# Patient Record
Sex: Male | Born: 1965 | Race: White | Hispanic: No | Smoking: Former smoker
Health system: Southern US, Community
[De-identification: ages and names within clinical notes are randomized; demographics above are authoritative.]

## PROBLEM LIST (undated history)

## (undated) DIAGNOSIS — Z8709 Personal history of other diseases of the respiratory system: Secondary | ICD-10-CM

## (undated) HISTORY — DX: Personal history of other diseases of the respiratory system: Z87.09

## (undated) HISTORY — PX: COLONOSCOPY: SHX174

## (undated) HISTORY — PX: EYE MUSCLE SURGERY: SHX370

---
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
## 2000-11-19 ENCOUNTER — Encounter: Payer: Self-pay | Admitting: Emergency Medicine

## 2000-11-19 ENCOUNTER — Encounter: Admission: RE | Admit: 2000-11-19 | Discharge: 2000-11-19 | Payer: Self-pay | Admitting: Emergency Medicine

## 2007-06-21 ENCOUNTER — Emergency Department (HOSPITAL_COMMUNITY): Admission: EM | Admit: 2007-06-21 | Discharge: 2007-06-21 | Payer: Self-pay | Admitting: Emergency Medicine

## 2007-06-24 ENCOUNTER — Encounter: Admission: RE | Admit: 2007-06-24 | Discharge: 2007-06-24 | Payer: Self-pay | Admitting: Emergency Medicine

## 2010-12-02 LAB — DIFFERENTIAL
Basophils Absolute: 0.2 — ABNORMAL HIGH
Basophils Relative: 3 — ABNORMAL HIGH
Eosinophils Absolute: 0.1
Eosinophils Relative: 1
Lymphocytes Relative: 18
Lymphs Abs: 1.5
Monocytes Absolute: 0.8
Monocytes Relative: 10
Neutro Abs: 5.7
Neutrophils Relative %: 68

## 2010-12-02 LAB — CBC
HCT: 43
Hemoglobin: 14.6
MCHC: 33.9
MCV: 85.9
Platelets: 245
RBC: 5.01
RDW: 13.7
WBC: 8.4

## 2010-12-02 LAB — POCT I-STAT, CHEM 8
Chloride: 105
Glucose, Bld: 106 — ABNORMAL HIGH
HCT: 44
Hemoglobin: 15
Potassium: 3.3 — ABNORMAL LOW
Sodium: 140

## 2010-12-02 LAB — POCT CARDIAC MARKERS
CKMB, poc: 1.4
CKMB, poc: 1.7
Myoglobin, poc: 98.3
Operator id: 277751

## 2010-12-02 LAB — D-DIMER, QUANTITATIVE: D-Dimer, Quant: 0.46

## 2013-11-23 ENCOUNTER — Encounter: Payer: Self-pay | Admitting: Internal Medicine

## 2013-11-29 ENCOUNTER — Ambulatory Visit (AMBULATORY_SURGERY_CENTER): Payer: Self-pay | Admitting: *Deleted

## 2013-11-29 VITALS — Ht 71.0 in | Wt 252.8 lb

## 2013-11-29 DIAGNOSIS — Z1211 Encounter for screening for malignant neoplasm of colon: Secondary | ICD-10-CM

## 2013-11-29 NOTE — Progress Notes (Signed)
No allergies to eggs or soy. No problem with anesthesia.  Pt given Emmi instructions for colonoscopy  No oxygen use  No diet drug use

## 2013-12-06 ENCOUNTER — Ambulatory Visit (AMBULATORY_SURGERY_CENTER): Payer: Commercial Managed Care - PPO | Admitting: Internal Medicine

## 2013-12-06 ENCOUNTER — Encounter: Payer: Self-pay | Admitting: Internal Medicine

## 2013-12-06 VITALS — BP 113/70 | HR 57 | Temp 96.3°F | Resp 19 | Ht 71.0 in | Wt 252.0 lb

## 2013-12-06 DIAGNOSIS — Z1211 Encounter for screening for malignant neoplasm of colon: Secondary | ICD-10-CM

## 2013-12-06 MED ORDER — SODIUM CHLORIDE 0.9 % IV SOLN: 500.0000 mL | INTRAVENOUS | Status: DC

## 2013-12-06 NOTE — Patient Instructions (Addendum)
Your colonoscopy was normal.  Next routine colonoscopy in 10 years - 2025  I appreciate the opportunity to care for you. Babe Anthis E. Kevonna Nolte, MD, FACG   YOU HAD AN ENDOSCOPIC PROCEDURE TODAY AT THE [facility]: Refer to the procedure report that was given to you for any specific questions about what was found during the examination.  If the procedure report does not answer your questions, please call your gastroenterologist to clarify.  If you requested that your care partner not be given the details of your procedure findings, then the procedure report has been included in a sealed envelope for you to review at your convenience later.  YOU SHOULD EXPECT: Some feelings of bloating in the abdomen. Passage of more gas than usual.  Walking can help get rid of the air that was put into your GI tract during the procedure and reduce the bloating. If you had a lower endoscopy (such as a colonoscopy or flexible sigmoidoscopy) you may notice spotting of blood in your stool or on the toilet paper. If you underwent a bowel prep for your procedure, then you may not have a normal bowel movement for a few days.  DIET: Your first meal following the procedure should be a light meal and then it is ok to progress to your normal diet.  A half-sandwich or bowl of soup is an example of a good first meal.  Heavy or fried foods are harder to digest and may make you feel nauseous or bloated.  Likewise meals heavy in dairy and vegetables can cause extra gas to form and this can also increase the bloating.  Drink plenty of fluids but you should avoid alcoholic beverages for 24 hours.  ACTIVITY: Your care partner should take you home directly after the procedure.  You should plan to take it easy, moving slowly for the rest of the day.  You can resume normal activity the day after the procedure however you should NOT DRIVE or use heavy machinery for 24 hours (because of the sedation medicines used during the test).     SYMPTOMS TO REPORT IMMEDIATELY: A gastroenterologist can be reached at any hour.  During normal business hours, 8:30 AM to 5:00 PM Monday through Friday, call [phone].  After hours and on weekends, please call the GI answering service at [phone] who will take a message and have the physician on call contact you.   Following lower endoscopy (colonoscopy or flexible sigmoidoscopy):  Excessive amounts of blood in the stool  Significant tenderness or worsening of abdominal pains  Swelling of the abdomen that is new, acute  Fever of 100F or higher  FOLLOW UP: If any biopsies were taken you will be contacted by phone or by letter within the next 1-3 weeks.  Call your gastroenterologist if you have not heard about the biopsies in 3 weeks.  Our staff will call the home number listed on your records the next business day following your procedure to check on you and address any questions or concerns that you may have at that time regarding the information given to you following your procedure. This is a courtesy call and so if there is no answer at the home number and we have not heard from you through the emergency physician on call, we will assume that you have returned to your regular daily activities without incident.  SIGNATURES/CONFIDENTIALITY: You and/or your care partner have signed paperwork which will be entered into your electronic medical record.  These   signatures attest to the fact that that the information above on your After Visit Summary has been reviewed and is understood.  Full responsibility of the confidentiality of this discharge information lies with you and/or your care-partner. 

## 2013-12-06 NOTE — Progress Notes (Signed)
Patient awakening,vss,report to rn 

## 2013-12-06 NOTE — Op Note (Signed)
[facility] [address] [place], [id number]   COLONOSCOPY PROCEDURE REPORT  PATIENT: [name], Nicholas A  MR#: [id number] BIRTHDATE: 05-06-1965 , 48  yrs. old GENDER: male ENDOSCOPIST: Iva Booparl E Ellamarie Naeve, MD, [facility] PROCEDURE DATE:  12/06/2013 PROCEDURE:   Colonoscopy, screening First Screening Colonoscopy - Avg.  risk and is 50 yrs.  old or older Yes.  Prior Negative Screening - Now for repeat screening. N/A  History of Adenoma - Now for follow-up colonoscopy & has been > or = to 3 yrs.  N/A  Polyps Removed Today? No.  Polyps Removed Today? No.  Recommend repeat exam, <10 yrs? Polyps Removed Today? No.  Recommend repeat exam, <10 yrs? No. ASA CLASS:   Class II INDICATIONS:average risk for colorectal cancer and first colonoscopy. MEDICATIONS: Propofol 250 mg IV and Monitored anesthesia care  DESCRIPTION OF PROCEDURE:   After the risks benefits and alternatives of the procedure were thoroughly explained, informed consent was obtained.  The digital rectal exam revealed no abnormalities of the rectum and revealed no prostatic nodules. The LB [id number]  endoscope was introduced through the anus and advanced to the cecum, which was identified by both the appendix and ileocecal valve. No adverse events experienced.   The quality of the prep was excellent, using MiraLax  The instrument was then slowly withdrawn as the colon was fully examined.      COLON FINDINGS: A normal appearing cecum, ileocecal valve, and appendiceal orifice were identified.  The ascending, transverse, descending, sigmoid colon, and rectum appeared unremarkable. Right colon retroflexion included.  Retroflexed views revealed no abnormalities. The time to cecum=2 minutes 20 seconds.  Withdrawal time=8 minutes 28 seconds.  The scope was withdrawn and the procedure completed. COMPLICATIONS: There were no immediate complications.  ENDOSCOPIC IMPRESSION: Normal colonoscopy - excellent prep -  first screening  RECOMMENDATIONS: Repeat colonoscopy 10 years - 2025  eSigned:  Iva Booparl E Hoyt Leanos, MD, [facility] 12/06/2013 10:44 AM   cc: Creola CornJohn Russo, MD and The Patient

## 2013-12-07 ENCOUNTER — Telehealth: Payer: Self-pay | Admitting: *Deleted

## 2013-12-07 NOTE — Telephone Encounter (Signed)
No answer. Name identifier. Message left to call if any questions or concerns. 

## 2014-03-09 HISTORY — PX: KNEE ARTHROSCOPY: SUR90

## 2014-12-08 DIAGNOSIS — I2699 Other pulmonary embolism without acute cor pulmonale: Secondary | ICD-10-CM

## 2014-12-08 DIAGNOSIS — S82209A Unspecified fracture of shaft of unspecified tibia, initial encounter for closed fracture: Secondary | ICD-10-CM

## 2014-12-08 HISTORY — DX: Unspecified fracture of shaft of unspecified tibia, initial encounter for closed fracture: S82.209A

## 2014-12-08 HISTORY — DX: Other pulmonary embolism without acute cor pulmonale: I26.99

## 2015-02-09 ENCOUNTER — Inpatient Hospital Stay (HOSPITAL_COMMUNITY)
Admission: EM | Admit: 2015-02-09 | Discharge: 2015-02-14 | DRG: 176 | Disposition: A | Discharge status: Home / Self Care | Payer: Commercial Managed Care - PPO | Attending: Internal Medicine | Admitting: Internal Medicine

## 2015-02-09 ENCOUNTER — Encounter (HOSPITAL_COMMUNITY): Payer: Self-pay | Admitting: Emergency Medicine

## 2015-02-09 ENCOUNTER — Emergency Department (HOSPITAL_COMMUNITY): Payer: Commercial Managed Care - PPO

## 2015-02-09 ENCOUNTER — Inpatient Hospital Stay (HOSPITAL_COMMUNITY): Payer: Commercial Managed Care - PPO

## 2015-02-09 DIAGNOSIS — S82142A Displaced bicondylar fracture of left tibia, initial encounter for closed fracture: Secondary | ICD-10-CM | POA: Diagnosis present

## 2015-02-09 DIAGNOSIS — S82142S Displaced bicondylar fracture of left tibia, sequela: Secondary | ICD-10-CM

## 2015-02-09 DIAGNOSIS — D72829 Elevated white blood cell count, unspecified: Secondary | ICD-10-CM | POA: Diagnosis not present

## 2015-02-09 DIAGNOSIS — I82409 Acute embolism and thrombosis of unspecified deep veins of unspecified lower extremity: Secondary | ICD-10-CM

## 2015-02-09 DIAGNOSIS — R9431 Abnormal electrocardiogram [ECG] [EKG]: Secondary | ICD-10-CM | POA: Diagnosis present

## 2015-02-09 DIAGNOSIS — E876 Hypokalemia: Secondary | ICD-10-CM | POA: Diagnosis present

## 2015-02-09 DIAGNOSIS — R0602 Shortness of breath: Secondary | ICD-10-CM | POA: Diagnosis present

## 2015-02-09 DIAGNOSIS — I82433 Acute embolism and thrombosis of popliteal vein, bilateral: Secondary | ICD-10-CM | POA: Diagnosis present

## 2015-02-09 DIAGNOSIS — I2699 Other pulmonary embolism without acute cor pulmonale: Secondary | ICD-10-CM

## 2015-02-09 DIAGNOSIS — R000 Tachycardia, unspecified: Secondary | ICD-10-CM | POA: Diagnosis present

## 2015-02-09 DIAGNOSIS — I82403 Acute embolism and thrombosis of unspecified deep veins of lower extremity, bilateral: Secondary | ICD-10-CM | POA: Diagnosis present

## 2015-02-09 DIAGNOSIS — Z7982 Long term (current) use of aspirin: Secondary | ICD-10-CM | POA: Diagnosis not present

## 2015-02-09 DIAGNOSIS — I82413 Acute embolism and thrombosis of femoral vein, bilateral: Secondary | ICD-10-CM | POA: Diagnosis present

## 2015-02-09 DIAGNOSIS — I82442 Acute embolism and thrombosis of left tibial vein: Secondary | ICD-10-CM | POA: Diagnosis present

## 2015-02-09 DIAGNOSIS — D72828 Other elevated white blood cell count: Secondary | ICD-10-CM | POA: Diagnosis present

## 2015-02-09 DIAGNOSIS — Z0000 Encounter for general adult medical examination without abnormal findings: Secondary | ICD-10-CM

## 2015-02-09 DIAGNOSIS — Z87891 Personal history of nicotine dependence: Secondary | ICD-10-CM

## 2015-02-09 DIAGNOSIS — I4581 Long QT syndrome: Secondary | ICD-10-CM

## 2015-02-09 LAB — BASIC METABOLIC PANEL
Anion gap: 12 (ref 5–15)
BUN: 14 mg/dL (ref 6–20)
CALCIUM: 9.4 mg/dL (ref 8.9–10.3)
CO2: 24 mmol/L (ref 22–32)
CREATININE: 0.99 mg/dL (ref 0.61–1.24)
Chloride: 102 mmol/L (ref 101–111)
GFR calc Af Amer: >60 mL/min (ref >60)
Glucose, Bld: 120 mg/dL — ABNORMAL HIGH (ref 65–99)
Potassium: 3.4 mmol/L — ABNORMAL LOW (ref 3.5–5.1)
SODIUM: 138 mmol/L (ref 135–145)

## 2015-02-09 LAB — ANTITHROMBIN III: AntiThromb III Func: 97 % (ref 75–120)

## 2015-02-09 LAB — CBC
HCT: 44.6 % (ref 39.0–52.0)
HEMATOCRIT: 39.2 % (ref 39.0–52.0)
HEMOGLOBIN: 12.5 g/dL — AB (ref 13.0–17.0)
Hemoglobin: 14.9 g/dL (ref 13.0–17.0)
MCH: 29 pg (ref 26.0–34.0)
MCH: 28.1 pg (ref 26.0–34.0)
MCHC: 33.4 g/dL (ref 30.0–36.0)
MCHC: 31.9 g/dL (ref 30.0–36.0)
MCV: 86.8 fL (ref 78.0–100.0)
MCV: 88.1 fL (ref 78.0–100.0)
PLATELETS: 331 10*3/uL (ref 150–400)
Platelets: 281 10*3/uL (ref 150–400)
RBC: 5.14 MIL/uL (ref 4.22–5.81)
RBC: 4.45 MIL/uL (ref 4.22–5.81)
RDW: 13 % (ref 11.5–15.5)
RDW: 13.2 % (ref 11.5–15.5)
WBC: 10.9 10*3/uL — AB (ref 4.0–10.5)
WBC: 13.7 10*3/uL — AB (ref 4.0–10.5)

## 2015-02-09 LAB — URINE MICROSCOPIC-ADD ON
BACTERIA UA: NONE SEEN
RBC / HPF: NONE SEEN RBC/hpf (ref 0–5)
SQUAMOUS EPITHELIAL / LPF: NONE SEEN
WBC UA: NONE SEEN WBC/hpf (ref 0–5)

## 2015-02-09 LAB — URINALYSIS, ROUTINE W REFLEX MICROSCOPIC
Bilirubin Urine: NEGATIVE
Glucose, UA: NEGATIVE mg/dL
KETONES UR: NEGATIVE mg/dL
LEUKOCYTES UA: NEGATIVE
NITRITE: NEGATIVE
PROTEIN: NEGATIVE mg/dL
Specific Gravity, Urine: >1.046 — ABNORMAL HIGH (ref 1.005–1.030)
pH: 5.5 (ref 5.0–8.0)

## 2015-02-09 LAB — COMPREHENSIVE METABOLIC PANEL
ALBUMIN: 3.3 g/dL — AB (ref 3.5–5.0)
ALT: 27 U/L (ref 17–63)
ANION GAP: 7 (ref 5–15)
AST: 20 U/L (ref 15–41)
Alkaline Phosphatase: 99 U/L (ref 38–126)
BILIRUBIN TOTAL: 0.8 mg/dL (ref 0.3–1.2)
BUN: 11 mg/dL (ref 6–20)
CALCIUM: 8.6 mg/dL — AB (ref 8.9–10.3)
CO2: 25 mmol/L (ref 22–32)
Chloride: 106 mmol/L (ref 101–111)
Creatinine, Ser: 0.89 mg/dL (ref 0.61–1.24)
GFR calc non Af Amer: >60 mL/min (ref >60)
GLUCOSE: 99 mg/dL (ref 65–99)
POTASSIUM: 3.9 mmol/L (ref 3.5–5.1)
SODIUM: 138 mmol/L (ref 135–145)
TOTAL PROTEIN: 7 g/dL (ref 6.5–8.1)

## 2015-02-09 LAB — HEPARIN LEVEL (UNFRACTIONATED)
HEPARIN UNFRACTIONATED: 0.27 [IU]/mL — AB (ref 0.30–0.70)
Heparin Unfractionated: 0.17 IU/mL — ABNORMAL LOW (ref 0.30–0.70)

## 2015-02-09 LAB — I-STAT CHEM 8, ED
BUN: 13 mg/dL (ref 6–20)
CALCIUM ION: 1.12 mmol/L (ref 1.12–1.23)
Chloride: 101 mmol/L (ref 101–111)
Creatinine, Ser: 0.9 mg/dL (ref 0.61–1.24)
GLUCOSE: 119 mg/dL — AB (ref 65–99)
HCT: 49 % (ref 39.0–52.0)
HEMOGLOBIN: 16.7 g/dL (ref 13.0–17.0)
Potassium: 3.4 mmol/L — ABNORMAL LOW (ref 3.5–5.1)
Sodium: 139 mmol/L (ref 135–145)
TCO2: 25 mmol/L (ref 0–100)

## 2015-02-09 LAB — TYPE AND SCREEN
ABO/RH(D): O NEG
ANTIBODY SCREEN: NEGATIVE

## 2015-02-09 LAB — I-STAT TROPONIN, ED: Troponin i, poc: 0 ng/mL (ref 0.00–0.08)

## 2015-02-09 LAB — APTT: APTT: 30 s (ref 24–37)

## 2015-02-09 LAB — ABO/RH: ABO/RH(D): O NEG

## 2015-02-09 LAB — PROTIME-INR
INR: 1.11 (ref 0.00–1.49)
Prothrombin Time: 14.5 seconds (ref 11.6–15.2)

## 2015-02-09 MED ORDER — SODIUM CHLORIDE 0.9 % IJ SOLN
3.0000 mL | Freq: Two times a day (BID) | INTRAMUSCULAR | Status: DC
  Administered 2015-02-10 – 2015-02-14 (×7): 3 mL via INTRAVENOUS

## 2015-02-09 MED ORDER — HYDROCODONE-ACETAMINOPHEN 5-325 MG PO TABS
2.0000 | ORAL_TABLET | ORAL | Status: DC | PRN
  Administered 2015-02-09 (×4): 2 via ORAL
  Filled 2015-02-09 (×5): qty 2

## 2015-02-09 MED ORDER — MORPHINE SULFATE (PF) 2 MG/ML IV SOLN
2.0000 mg | INTRAVENOUS | Status: DC | PRN
  Administered 2015-02-09 (×2): 2 mg via INTRAVENOUS
  Filled 2015-02-09 (×2): qty 1

## 2015-02-09 MED ORDER — ZOLPIDEM TARTRATE 5 MG PO TABS: 5.0000 mg | ORAL_TABLET | Freq: Every evening | ORAL | Status: DC | PRN

## 2015-02-09 MED ORDER — HEPARIN BOLUS VIA INFUSION
5000.0000 [IU] | Freq: Once | INTRAVENOUS | Status: AC
  Administered 2015-02-09: 5000 [IU] via INTRAVENOUS
  Filled 2015-02-09: qty 5000

## 2015-02-09 MED ORDER — ZOLPIDEM TARTRATE 10 MG PO TABS
10.0000 mg | ORAL_TABLET | Freq: Every day | ORAL | Status: DC
  Administered 2015-02-09 – 2015-02-10 (×2): 10 mg via ORAL
  Filled 2015-02-09 (×2): qty 1

## 2015-02-09 MED ORDER — KETOROLAC TROMETHAMINE 30 MG/ML IJ SOLN
30.0000 mg | Freq: Four times a day (QID) | INTRAMUSCULAR | Status: AC | PRN
  Administered 2015-02-09 – 2015-02-14 (×7): 30 mg via INTRAVENOUS
  Filled 2015-02-09 (×7): qty 1

## 2015-02-09 MED ORDER — POTASSIUM CHLORIDE CRYS ER 20 MEQ PO TBCR
40.0000 meq | EXTENDED_RELEASE_TABLET | Freq: Once | ORAL | Status: AC
  Administered 2015-02-09: 40 meq via ORAL
  Filled 2015-02-09: qty 2

## 2015-02-09 MED ORDER — HEPARIN BOLUS VIA INFUSION
2500.0000 [IU] | Freq: Once | INTRAVENOUS | Status: AC
  Administered 2015-02-09: 2500 [IU] via INTRAVENOUS
  Filled 2015-02-09: qty 2500

## 2015-02-09 MED ORDER — IOHEXOL 350 MG/ML SOLN
100.0000 mL | Freq: Once | INTRAVENOUS | Status: AC | PRN
  Administered 2015-02-09: 100 mL via INTRAVENOUS

## 2015-02-09 MED ORDER — LEVALBUTEROL HCL 0.63 MG/3ML IN NEBU: 0.6300 mg | INHALATION_SOLUTION | Freq: Four times a day (QID) | RESPIRATORY_TRACT | Status: DC | PRN

## 2015-02-09 MED ORDER — SODIUM CHLORIDE 0.9 % IV BOLUS (SEPSIS)
1000.0000 mL | Freq: Once | INTRAVENOUS | Status: AC
  Administered 2015-02-09: 1000 mL via INTRAVENOUS

## 2015-02-09 MED ORDER — FENTANYL CITRATE (PF) 100 MCG/2ML IJ SOLN
100.0000 ug | Freq: Once | INTRAMUSCULAR | Status: AC
Start: 2015-02-09 — End: 2015-02-09
  Administered 2015-02-09: 100 ug via INTRAVENOUS
  Filled 2015-02-09: qty 2

## 2015-02-09 MED ORDER — ACETAMINOPHEN 325 MG PO TABS: 650.0000 mg | ORAL_TABLET | Freq: Four times a day (QID) | ORAL | Status: DC | PRN

## 2015-02-09 MED ORDER — ALUM & MAG HYDROXIDE-SIMETH 200-200-20 MG/5ML PO SUSP: 30.0000 mL | Freq: Four times a day (QID) | ORAL | Status: DC | PRN

## 2015-02-09 MED ORDER — FENTANYL CITRATE (PF) 100 MCG/2ML IJ SOLN
50.0000 ug | Freq: Once | INTRAMUSCULAR | Status: AC
  Administered 2015-02-09: 50 ug via INTRAVENOUS
  Filled 2015-02-09: qty 2

## 2015-02-09 MED ORDER — HEPARIN (PORCINE) IN NACL 100-0.45 UNIT/ML-% IJ SOLN
1700.0000 [IU]/h | INTRAMUSCULAR | Status: DC
  Administered 2015-02-09: 1700 [IU]/h via INTRAVENOUS
  Filled 2015-02-09: qty 250

## 2015-02-09 MED ORDER — HEPARIN (PORCINE) IN NACL 100-0.45 UNIT/ML-% IJ SOLN
1700.0000 [IU]/h | INTRAMUSCULAR | Status: DC
  Administered 2015-02-09: 1400 [IU]/h via INTRAVENOUS
  Administered 2015-02-09: 1300 [IU]/h via INTRAVENOUS
  Filled 2015-02-09 (×2): qty 250

## 2015-02-09 NOTE — ED Notes (Signed)
Pt voided in urinal. Reports severe CP and some sob.  Gave IVP meds that were ordered, see MAR.  Pt reports relief of pain after receiving meds.

## 2015-02-09 NOTE — Progress Notes (Signed)
ANTICOAGULATION CONSULT NOTE - Follow Up Consult  Pharmacy Consult for heparin IV Indication: suspected pulmonary embolus per CTA  No Known Allergies  Patient Measurements: Height: 5\' 10"  (177.8 cm) Weight: 217 lb 2.5 oz (98.5 kg) IBW/kg (Calculated) : 73 Heparin Dosing Weight: 93 kg  Vital Signs: Temp: 97.8 F (36.6 C) (12/03 0339) Temp Source: Oral (12/03 0339) BP: 128/75 mmHg (12/03 0838) Pulse Rate: 103 (12/03 0838)  Labs:  Recent Labs  02/09/15 0039 02/09/15 0045 02/09/15 0750 02/09/15 1100  HGB 16.7 14.9 12.5*  --   HCT 49.0 44.6 39.2  --   PLT  --  331 281  --   APTT  --  30  --   --   LABPROT  --  14.5  --   --   INR  --  1.11  --   --   HEPARINUNFRC  --   --   --  0.27*  CREATININE 0.90 0.99 0.89  --     Estimated Creatinine Clearance: 115.6 mL/min (by C-G formula based on Cr of 0.89).   Assessment: Patient is a 49 y.o M presented to the ED on 12/3 with c/o SOB and CP.  chest CTA is suspicious for BL LL PE.  Heparin drip started for thrombosis.  First heparin drip now back slightly sub-therapuric at 0.27.  CBC stable, no bleeding documented.  Goal of Therapy:  Heparin level 0.3-0.7 units/ml Monitor platelets by anticoagulation protocol: Yes   Plan:  - Increase heparin drip to 1400 units/hr - recheck another 6 hour heparin level  Nicholas Conway P 02/09/2015,11:48 AM

## 2015-02-09 NOTE — ED Notes (Signed)
Patient presents for SOB and left lower chest pain that started earlier this evening. Patient denies N/V, diaphoresis, fever or chills. Patient reports aspirin therapy for recent left leg surgery. Patient states he's concerned he has a PE.
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
## 2015-02-09 NOTE — Progress Notes (Signed)
PT Cancellation Note  Patient Details Name: Nicholas PinkJohn A Conway MRN: [id number] DOB: [birth year]   Cancelled Treatment:     PT eval order received but completion deferred at RN request 2* pt lethargy.  Will follow.   Juleah Paradise 02/09/2015, 11:08 AM

## 2015-02-09 NOTE — Progress Notes (Signed)
OT Cancellation Note  Patient Details Name: Nicholas Conway A I[place]ine MRN: [id number] DOB: August 15, 1965   Cancelled Treatment:    Reason Eval/Treat Not Completed: Other (comment) --  OT eval order received but completion deferred at RN request 2* pt lethargy. Will follow.  Lonna Rabold A 02/09/2015, 4:03 PM

## 2015-02-09 NOTE — Progress Notes (Signed)
I have seen and assessed patient and agree with Dr.Niu's assessment and plan. Patient is a pleasant [age] year old gentleman with recent left tibial plateau fracture presented with shortness of breath and chest pain and found to have PE per CT angiogram. 2-D echo and lower extremity Dopplers pending. Continue IV heparin, pain management, supportive care.

## 2015-02-09 NOTE — Progress Notes (Signed)
ANTICOAGULATION CONSULT NOTE - Initial Consult  Pharmacy Consult for IV Heparin Indication: pulmonary embolus  No Known Allergies  Patient Measurements:   Hw=80 kg  Vital Signs: Temp: 98.2 F (36.8 C) (12/03 0110) BP: 118/91 mmHg (12/03 0110) Pulse Rate: 113 (12/03 0110)  Labs:  Recent Labs  02/09/15 0039 02/09/15 0045  HGB 16.7 14.9  HCT 49.0 44.6  PLT  --  331  APTT  --  30  LABPROT  --  14.5  INR  --  1.11  CREATININE 0.90 0.99    CrCl cannot be calculated (Unknown ideal weight.).   Medical History: Past Medical History  Diagnosis Date  . Hx of pneumothorax 1983    Medications:   (Not in a hospital admission) Scheduled:  . sodium chloride  3 mL Intravenous Q12H   Infusions:    Assessment: 49 yoM with pleuritic CP, CT suspicious for bilateral LL PE.  IV Heparin per Rx.  Goal of Therapy:  Heparin level 0.3-0.7 units/ml Monitor platelets by anticoagulation protocol: Yes   Plan:   Heparin 5000 unit bolus x1  Heparin drip @1300  units/hr  1st HL 10:30 am  Daily HL/CBC  Susanne GreenhouseGreen, Meloni Hinz R 02/09/2015,2:56 AM

## 2015-02-09 NOTE — Progress Notes (Signed)
Echocardiogram 2D Echocardiogram has been performed.  02/09/2015 12:43 PM Gertie FeyMichelle Doreather Hoxworth, RVT, RDCS, RDMS for Perley Jainich Lombardo, RDCS

## 2015-02-09 NOTE — Progress Notes (Signed)
Report received from A. Helsabeck,RN. No change in assessment. Cathi Hazan Johnson 
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
## 2015-02-09 NOTE — Progress Notes (Signed)
Pharmacy - Brief Note (heparin level follow-up)  Pharmacy Consult for heparin IV Indication: suspected pulmonary embolus per CTA  See pharmacist's note from earlier today for details  Recheck heparin level following rate adjust = 0.17 (down from 0.27 = initial level).  RN states no known issues with infusion from time she picked up patient and running at expected rate of 1400 units/hr. Suspect initial level affected by bolus  Heparin Dosing Weight: 93 kg  Plan:  Heparin bolus 3000 units then Increase heparin gtt to 1700 units/hr  Check 6h heparin level  Daily heparin level, CBC  Juliette Alcideustin Zeigler, PharmD, BCPS.   Pager: [phone] 02/09/2015 7:15 PM

## 2015-02-09 NOTE — ED Notes (Signed)
spo2 92% on RA, placed pt on 2L [place]

## 2015-02-09 NOTE — ED Notes (Signed)
Patient transported to CT 

## 2015-02-10 ENCOUNTER — Inpatient Hospital Stay (HOSPITAL_COMMUNITY): Payer: Commercial Managed Care - PPO

## 2015-02-10 DIAGNOSIS — I82403 Acute embolism and thrombosis of unspecified deep veins of lower extremity, bilateral: Secondary | ICD-10-CM | POA: Diagnosis present

## 2015-02-10 DIAGNOSIS — S82142D Displaced bicondylar fracture of left tibia, subsequent encounter for closed fracture with routine healing: Secondary | ICD-10-CM

## 2015-02-10 DIAGNOSIS — Z0000 Encounter for general adult medical examination without abnormal findings: Secondary | ICD-10-CM

## 2015-02-10 LAB — GLUCOSE, CAPILLARY
Glucose-Capillary: 62 mg/dL — ABNORMAL LOW (ref 65–99)
Glucose-Capillary: 88 mg/dL (ref 65–99)

## 2015-02-10 LAB — CBC
HCT: 34.8 % — ABNORMAL LOW (ref 39.0–52.0)
Hemoglobin: 11.5 g/dL — ABNORMAL LOW (ref 13.0–17.0)
MCH: 28.5 pg (ref 26.0–34.0)
MCHC: 33 g/dL (ref 30.0–36.0)
MCV: 86.4 fL (ref 78.0–100.0)
PLATELETS: 257 10*3/uL (ref 150–400)
RBC: 4.03 MIL/uL — ABNORMAL LOW (ref 4.22–5.81)
RDW: 13 % (ref 11.5–15.5)
WBC: 9.3 10*3/uL (ref 4.0–10.5)

## 2015-02-10 LAB — HEPARIN LEVEL (UNFRACTIONATED)
HEPARIN UNFRACTIONATED: 0.29 [IU]/mL — AB (ref 0.30–0.70)
Heparin Unfractionated: 0.36 IU/mL (ref 0.30–0.70)
Heparin Unfractionated: 0.34 IU/mL (ref 0.30–0.70)

## 2015-02-10 MED ORDER — WARFARIN - PHARMACIST DOSING INPATIENT: Freq: Every day | Status: DC

## 2015-02-10 MED ORDER — HEPARIN (PORCINE) IN NACL 100-0.45 UNIT/ML-% IJ SOLN
1950.0000 [IU]/h | INTRAMUSCULAR | Status: AC
  Administered 2015-02-10 – 2015-02-12 (×3): 1950 [IU]/h via INTRAVENOUS
  Filled 2015-02-10 (×5): qty 250

## 2015-02-10 MED ORDER — WARFARIN VIDEO: Freq: Once | Status: DC

## 2015-02-10 MED ORDER — COUMADIN BOOK
Freq: Once | Status: AC
  Administered 2015-02-10: 1
  Filled 2015-02-10: qty 1

## 2015-02-10 MED ORDER — HEPARIN (PORCINE) IN NACL 100-0.45 UNIT/ML-% IJ SOLN
1900.0000 [IU]/h | INTRAMUSCULAR | Status: DC
  Administered 2015-02-10 (×2): 1900 [IU]/h via INTRAVENOUS
  Filled 2015-02-10 (×2): qty 250

## 2015-02-10 MED ORDER — WARFARIN SODIUM 10 MG PO TABS
10.0000 mg | ORAL_TABLET | Freq: Once | ORAL | Status: AC
  Administered 2015-02-10: 10 mg via ORAL
  Filled 2015-02-10: qty 1
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
## 2015-02-10 NOTE — Progress Notes (Signed)
ANTICOAGULATION CONSULT NOTE - Follow Up Consult  Pharmacy Consult for heparin IV Indication: suspected pulmonary embolus per CTA and new BL LE DVTs  No Known Allergies  Patient Measurements: Height: 5\' 10"  (177.8 cm) Weight: 217 lb 13 oz (98.8 kg) IBW/kg (Calculated) : 73 Heparin Dosing Weight: 93 kg  Vital Signs: Temp: 97.7 F (36.5 C) (12/04 0700) Temp Source: Oral (12/04 0700) BP: 103/78 mmHg (12/04 0700) Pulse Rate: 86 (12/04 0700)  Labs:  Recent Labs  02/09/15 0039 02/09/15 0045 02/09/15 0750  02/09/15 1745 02/10/15 0156 02/10/15 1040  HGB 16.7 14.9 12.5*  --   --  11.5*  --   HCT 49.0 44.6 39.2  --   --  34.8*  --   PLT  --  331 281  --   --  257  --   APTT  --  30  --   --   --   --   --   LABPROT  --  14.5  --   --   --   --   --   INR  --  1.11  --   --   --   --   --   HEPARINUNFRC  --   --   --   < > 0.17* 0.29* 0.36  CREATININE 0.90 0.99 0.89  --   --   --   --   < > = values in this interval not displayed.  Estimated Creatinine Clearance: 115.7 mL/min (by C-G formula based on Cr of 0.89).   Assessment: Patient is a 49 y.o M with recent L tibial fracture who presented to the ED on 12/3 with c/o SOB and CP.  Chest CTA with suspicion for BL LL PE.  LE doppler on 12/04 showed bilateral LE DVTs.  Heparin drip started for PE/DVT. Pharmacy asked to start warfarin 12/4.  Baseline INR = 1.11  Today, 02/10/2015: - heparin level therapeutic at 0.36 - hgb and plt trending down - no bleeding documented - Drug-drug interactions with warfarin - none currently (on PRN IV toradol) - regular diet  Goal of Therapy:  Heparin level 0.3-0.7 units/ml Monitor platelets by anticoagulation protocol: Yes   Plan:  Day #1 VTE overlap therapy for acute VTE - Continue heparin drip to 1900 units/hr - recheck another 6 hour heparin level to confirm before changing to daily heparin level monitoring - Warfarin 10mg  PO x 1  - Daily INR  - warfarin education materials and  pharmacist to provide warfarin education  Juliette Alcideustin Zeigler, PharmD, BCPS.   Pager: [phone] 02/10/2015 12:52 PM

## 2015-02-10 NOTE — Progress Notes (Signed)
ANTICOAGULATION CONSULT NOTE - Follow Up Consult  Pharmacy Consult for heparin IV Indication: suspected pulmonary embolus per CTA and new BL LE DVTs  No Known Allergies  Patient Measurements: Height: 5\' 10"  (177.8 cm) Weight: 217 lb 13 oz (98.8 kg) IBW/kg (Calculated) : 73 Heparin Dosing Weight: 93 kg  Vital Signs: Temp: 97.7 F (36.5 C) (12/04 0700) Temp Source: Oral (12/04 0700) BP: 103/78 mmHg (12/04 0700) Pulse Rate: 86 (12/04 0700)  Labs:  Recent Labs  02/09/15 0039 02/09/15 0045 02/09/15 0750  02/09/15 1745 02/10/15 0156 02/10/15 1040  HGB 16.7 14.9 12.5*  --   --  11.5*  --   HCT 49.0 44.6 39.2  --   --  34.8*  --   PLT  --  331 281  --   --  257  --   APTT  --  30  --   --   --   --   --   LABPROT  --  14.5  --   --   --   --   --   INR  --  1.11  --   --   --   --   --   HEPARINUNFRC  --   --   --   < > 0.17* 0.29* 0.36  CREATININE 0.90 0.99 0.89  --   --   --   --   < > = values in this interval not displayed.  Estimated Creatinine Clearance: 115.7 mL/min (by C-G formula based on Cr of 0.89).   Assessment: Patient is a 49 y.o M with recent L tibial fracture who presented to the ED on 12/3 with c/o SOB and CP.  Chest CTA with suspicion for BL LL PE.  LE doppler on 12/04 showed bilateral LE DVTs.  Heparin drip started for PE/DVT.  Today, 02/10/2015: - heparin level therapeutic at 0.36 - hgb and plt trending down - no bleeding documented  Goal of Therapy:  Heparin level 0.3-0.7 units/ml Monitor platelets by anticoagulation protocol: Yes   Plan:  - Continue heparin drip to 1900 units/hr - recheck another 6 hour heparin level to confirm before changing to daily heparin level monitoring - f/u plan for transition to oral anticoagulation  Shakeerah Gradel P 02/10/2015,11:54 AM

## 2015-02-10 NOTE — Progress Notes (Signed)
Pharmacy - Brief Note (heparin level follow-up)  Pharmacy Consult for heparin IV Indication: suspected pulmonary embolus per CTA  See pharmacist's note from 12/3  for details  Recheck heparin level following rate adjust = 0.29 (down from 0.17 = previous level).  RN states no known issues with infusion and no bleeding noted.   Plan:  Increase heparin drip to 1900 units/hr  Check 6h heparin level  Daily heparin level, CBC  Lorenza EvangelistGreen, Tyan Lasure R 02/10/2015 3:21 AM

## 2015-02-10 NOTE — Evaluation (Addendum)
Physical Therapy Evaluation Patient Details Name: Nicholas Conway MRN: [id number] DOB: September 17, 1965 Today's Date: 02/10/2015   History of Present Illness  Nicholas Conway is a 49 y.o. male with PMH of recent left tibial plateau fracture on splinter, distant hx of pneumothorax, who presents with shortness of breath and chest pain; Pt with PE and bil LE DVTs  Clinical Impression  Pt admitted with above diagnosis. Pt currently with functional limitations due to the deficits listed below (see PT Problem List). Pt will benefit from skilled PT to increase their independence and safety with mobility to allow discharge to the venue listed below.  Pt functioning from w/c level at home prior to adm, will follow in acute setting  Pt with incr HR and gait distance limited by therapist;  HR at rest 99 During amb (25') HR up to 147 After amb/rest x 1min  HR 109  O2 sats 98-99% throughout, including on RA  X ~5 min, sats remained 98-99%, O2 replaced after PT at 2.5L   Follow Up Recommendations No PT follow up;Supervision - Intermittent (defer PT to ortho MD (Dr. Reece AgarG) after next visit)    Equipment Recommendations  None recommended by PT    Recommendations for Other Services       Precautions / Restrictions Precautions Required Braces or Orthoses: Knee Immobilizer - Left Knee Immobilizer - Left: On at all times Restrictions Weight Bearing Restrictions: Yes LLE Weight Bearing: Non weight bearing      Mobility  Bed Mobility Overal bed mobility: Needs Assistance Bed Mobility: Supine to Sit     Supine to sit: Supervision        Transfers Overall transfer level: Needs assistance Equipment used: Rolling walker (2 wheeled) Transfers: Sit to/from Stand Sit to Stand: Min guard         General transfer comment: cues for hand placement and safety, min/guard for balance during transition to RW; LOB posteriorly with min to recover after inital standing  Ambulation/Gait Ambulation/Gait  assistance: Min guard Ambulation Distance (Feet): 25 Feet Assistive device: Rolling walker (2 wheeled) Gait Pattern/deviations: Step-to pattern   Gait velocity interpretation: Below normal speed for age/gender General Gait Details: cues for RW safety, pt maintains NWB   Stairs            Wheelchair Mobility    Modified Rankin (Stroke Patients Only)       Balance Overall balance assessment: Needs assistance         Standing balance support: Bilateral upper extremity supported;During functional activity Standing balance-Leahy Scale: Poor                               Pertinent Vitals/Pain Pain Assessment: No/denies pain    Home Living Family/patient expects to be discharged to:: Private residence Living Arrangements: Alone   Type of Home: House       Home Layout: One level Home Equipment: Shower seat;Wheelchair - manual;Bedside commode;Walker - 2 wheels      Prior Function Level of Independence: Independent with assistive device(s);Needs assistance   Gait / Transfers Assistance Needed: using W/C since tibial plateau fx           Hand Dominance        Extremity/Trunk Assessment   Upper Extremity Assessment: Overall WFL for tasks assessed           Lower Extremity Assessment: LLE deficits/detail   LLE Deficits / Details: ankle WFL, pt does I SLR  with KI on     Communication   Communication: No difficulties  Cognition Arousal/Alertness: Awake/alert Behavior During Therapy: WFL for tasks assessed/performed Overall Cognitive Status: Within Functional Limits for tasks assessed                      General Comments      Exercises        Assessment/Plan    PT Assessment Patient needs continued PT services  PT Diagnosis Difficulty walking   PT Problem List Decreased balance;Decreased mobility;Decreased activity tolerance  PT Treatment Interventions DME instruction;Gait training;Therapeutic activities;Therapeutic  exercise;Functional mobility training;Patient/family education   PT Goals (Current goals can be found in the Care Plan section) Acute Rehab PT Goals Patient Stated Goal: get rid of this 02 and be able to put some weight through LLE after next ortho visit PT Goal Formulation: With patient Time For Goal Achievement: 02/17/15 Potential to Achieve Goals: Good    Frequency Min 3X/week   Barriers to discharge        Co-evaluation   Reason for Co-Treatment: For patient/therapist safety PT goals addressed during session: Mobility/safety with mobility OT goals addressed during session: ADL's and self-care       End of Session Equipment Utilized During Treatment: Gait belt;Left knee immobilizer;Oxygen Activity Tolerance: Patient tolerated treatment well;No increased pain Patient left: in chair;with call bell/phone within reach Nurse Communication: Mobility status         Time: 1227-1255 PT Time Calculation (min) (ACUTE ONLY): 28 min   Charges:   PT Evaluation $Initial PT Evaluation Tier I: 1 Procedure     PT G Codes:        Nicholas Conway 2015/02/21, 1:35 PM

## 2015-02-10 NOTE — Progress Notes (Signed)
Hypoglycemic Event  CBG: 62 0809  Treatment: OJ  Symptoms: None   Follow-up CBG: Time: 0914 CBG Result: 88  Possible Reasons for Event:   Comments/MD notified:Thompson 0920    Lenox PondsJerry L Shalese Strahan

## 2015-02-10 NOTE — Progress Notes (Signed)
Nutrition Brief Note  Patient identified on the Malnutrition Screening Tool (MST) Report  Patient currently eating 100%.  Wt Readings from Last 15 Encounters:  02/10/15 217 lb 13 oz (98.8 kg)  12/06/13 252 lb (114.306 kg)  11/29/13 252 lb 12.8 oz (114.669 kg)    Body mass index is 31.25 kg/(m^2). Patient meets criteria for obesity based on current BMI.   Current diet order is regular patient is consuming approximately 100% of meals at this time. Labs and medications reviewed.   No nutrition interventions warranted at this time. If nutrition issues arise, please consult RD.   Tilda FrancoLindsey Danise Dehne, MS, RD, LDN Pager: [phone] After Hours Pager: [phone]

## 2015-02-10 NOTE — Progress Notes (Signed)
*  Preliminary Results* Bilateral lower extremity venous duplex completed. Bilateral lower extremities are positive for extensive acute, mostly occlusive,  deep vein thrombosis involving bilateral saphenofemoral junctions, common femoral, femoral, popliteal, gastrocnemius, posterior tibial, and peroneal veins. There is no evidence of Baker's cyst bilaterally.  Preliminary results discussed with Annabelle HarmanDana, RN.  02/10/2015 8:32 AM  Gertie FeyMichelle Creig Landin, RVT, RDCS, RDMS

## 2015-02-10 NOTE — Progress Notes (Signed)
Pharmacy: Re- heparin  Patient's a 49 y.o M on heparin for new PE/DVT.  Repeat heparin level now back therapeutic at 0.34 (goal 0.3-0.7).  No bleeding documented.  Plan: - with level at lower end of therapeutic range, will increase drip slightly to 1950 units/hr - will f/u with AM level and adjust if needed  Dorna LeitzAnh Orean Giarratano, PharmD, BCPS 02/10/2015 5:30 PM

## 2015-02-10 NOTE — Progress Notes (Signed)
[facility] PROGRESS NOTE  [place] PinkJohn A Camerer ZOX:[id number] DOB: Aug 27, 1965 DOA: 02/09/2015 PCP: Gwen PoundsUSSO,Adilson M, MD  Assessment/Plan: #1 pulmonary emboli/extensive bilateral DVT Questionable etiology. Patient with recent tibial plateau fracture. Hypercoagulable panel pending. 2-D echo with no right ventricular strain. Bilateral lower extremity Dopplers consistent with extensive bilateral DVT. Continue IV heparin. Will place patient on Coumadin with goal INR 2-3. Pain management. Supportive care. Follow.  #2 left tibial plateau fracture Pain management. Outpatient follow-up with orthopedics.  #3 hypokalemia Repleted.  #4 leukocytosis Likely reactive leukocytosis. WBC trending down. Follow.  #5 QT prolongation Repeat EKG. Follow.  #6 prophylaxis Heparin for DVT prophylaxis.   Code Status: Full Family Communication: Updated patient and friend at bedside. Disposition Plan: Home when medically stable.   Consultants:  None  Procedures:  CT chest 02/09/2015  2-D echo 02/09/2015  Bilateral lower extremity Dopplers 02/10/2015  Chest x-ray 02/09/2015  Antibiotics:  None  HPI/Subjective: Patient states shortness of breath improving. Patient states pleuritic chest pain improving. No lower extremity pain. Feeling much better than on admission.  Objective: Filed Vitals:   02/09/15 2300 02/10/15 0700  BP: 114/79 103/78  Pulse: 79 86  Temp: 97.6 F (36.4 C) 97.7 F (36.5 C)  Resp: 18 18    Intake/Output Summary (Last 24 hours) at 02/10/15 1211 Last data filed at 02/09/15 1900  Gross per 24 hour  Intake     56 ml  Output      0 ml  Net     56 ml   Filed Weights   02/09/15 0339 02/10/15 0700  Weight: 98.5 kg (217 lb 2.5 oz) 98.8 kg (217 lb 13 oz)    Exam:   General:  NAD  Cardiovascular: RRR  Respiratory: CTAB  Abdomen: Soft, nontender, nondistended, positive bowel sounds  Musculoskeletal: No clubbing cyanosis or edema. Right lower extremity in a  CAM boot.  Data Reviewed: Basic Metabolic Panel:  Recent Labs Lab 02/09/15 0039 02/09/15 0045 02/09/15 0750  NA 139 138 138  K 3.4* 3.4* 3.9  CL 101 102 106  CO2  --  24 25  GLUCOSE 119* 120* 99  BUN 13 14 11   CREATININE 0.90 0.99 0.89  CALCIUM  --  9.4 8.6*   Liver Function Tests:  Recent Labs Lab 02/09/15 0750  AST 20  ALT 27  ALKPHOS 99  BILITOT 0.8  PROT 7.0  ALBUMIN 3.3*   No results for input(s): LIPASE, AMYLASE in the last 168 hours. No results for input(s): AMMONIA in the last 168 hours. CBC:  Recent Labs Lab 02/09/15 0039 02/09/15 0045 02/09/15 0750 02/10/15 0156  WBC  --  13.7* 10.9* 9.3  HGB 16.7 14.9 12.5* 11.5*  HCT 49.0 44.6 39.2 34.8*  MCV  --  86.8 88.1 86.4  PLT  --  331 281 257   Cardiac Enzymes: No results for input(s): CKTOTAL, CKMB, CKMBINDEX, TROPONINI in the last 168 hours. BNP (last 3 results) No results for input(s): BNP in the last 8760 hours.  ProBNP (last 3 results) No results for input(s): PROBNP in the last 8760 hours.  CBG:  Recent Labs Lab 02/10/15 0809 02/10/15 0911  GLUCAP 62* 88    Recent Results (from the past 240 hour(s))  Culture, Urine     Status: None (Preliminary result)   Collection Time: 02/09/15  8:29 AM  Result Value Ref Range Status   Specimen Description URINE, CLEAN CATCH  Final   Special Requests NONE  Final   Culture   Final  NO GROWTH < 24 HOURS Performed at [facility]    Report Status PENDING  Incomplete     Studies: Ct Angio Chest Pe W/cm &/or Wo Cm  02/09/2015  CLINICAL DATA:  49 year old male with pleuritic chest pain concern for pulmonary embolism. EXAM: CT ANGIOGRAPHY CHEST WITH CONTRAST TECHNIQUE: Multidetector CT imaging of the chest was performed using the standard protocol during bolus administration of intravenous contrast. Multiplanar CT image reconstructions and MIPs were obtained to evaluate the vascular anatomy. CONTRAST:  OMNIPAQUE IOHEXOL 350 MG/ML SOLN  COMPARISON:  CT dated 06/24/2007 FINDINGS: There is a focal area of pleural based ground-glass opacity at the right lung base concerning for an area of pulmonary infarct. There is mild paraseptal emphysema. The remainder of the lungs are clear. The small calcified granuloma is noted in the right middle lobe. No pleural effusion. The central airways are patent. The thoracic aorta appears unremarkable. Evaluation of the pulmonary arteries is very limited due to suboptimal opacification. Repeat study with additional contrast bolus to better opacifying the pulmonary artery was not successful. There are apparent hypodensity and filling defects primarily involving the left lower lobe pulmonary artery as well as segmental and subsegmental branches of the right lower lobe suspicious for pulmonary emboli. Clinical correlation recommended. V/Q scan recommended for further evaluation. There is no cardiomegaly or pericardial effusion. No evidence of right cardiac strain is identified. There is no hilar or mediastinal adenopathy. The visualized esophagus and thyroid gland appear grossly unremarkable. There is no axillary adenopathy. The chest wall soft tissues appear unremarkable. There is degenerative changes of the spine. No acute fracture. Review of the MIP images confirms the above findings. IMPRESSION: Suboptimal opacification of the pulmonary arteries. Findings are however is suspicious for bilateral lower lobe predominant pulmonary artery emboli. An area of ground-glass opacity at the right lung base likely represents focal pulmonary infarct. V/Q scan or anticoagulation treatment and follow-up with CT after 24-36 hours recommended. Critical Value/emergent results were called by telephone at the time of interpretation on 02/09/2015 at 2:12 am to Dr. Zadie Rhine , who verbally acknowledged these results. Electronically Signed   By: Elgie Collard M.D.   On: 02/09/2015 02:16   Dg Chest Portable 1 View  02/09/2015   CLINICAL DATA:  49 year old male with chest pain. History of pneumothorax EXAM: PORTABLE CHEST 1 VIEW COMPARISON:  Chest CT dated 06/24/2007 FINDINGS: The heart size and mediastinal contours are within normal limits. Both lungs are clear. The visualized skeletal structures are unremarkable. IMPRESSION: No active disease. Electronically Signed   By: Elgie Collard M.D.   On: 02/09/2015 00:56    Scheduled Meds: . sodium chloride  3 mL Intravenous Q12H  . zolpidem  10 mg Oral QHS   Continuous Infusions: . heparin 1,900 Units/hr (02/10/15 1039)    Principal Problem:   Pulmonary embolism (HCC) Active Problems:   DVT of lower extremity, bilateral (HCC)   Tibial plateau fracture, left   Hypokalemia   Leukocytosis   QT prolongation    Time spent: 35 mins    [facility] MD [facility] Pager [phone]. If 7PM-7AM, please contact night-coverage at [email or web address], password [facility] 02/10/2015, 12:11 PM  LOS: 1 day

## 2015-02-11 LAB — CBC
HEMATOCRIT: 35.5 % — AB (ref 39.0–52.0)
HEMOGLOBIN: 11.4 g/dL — AB (ref 13.0–17.0)
MCH: 28 pg (ref 26.0–34.0)
MCHC: 32.1 g/dL (ref 30.0–36.0)
MCV: 87.2 fL (ref 78.0–100.0)
Platelets: 294 10*3/uL (ref 150–400)
RBC: 4.07 MIL/uL — ABNORMAL LOW (ref 4.22–5.81)
RDW: 13.2 % (ref 11.5–15.5)
WBC: 6.4 10*3/uL (ref 4.0–10.5)

## 2015-02-11 LAB — URINE CULTURE

## 2015-02-11 LAB — PROTIME-INR
INR: 1.2 (ref 0.00–1.49)
PROTHROMBIN TIME: 15.4 s — AB (ref 11.6–15.2)

## 2015-02-11 LAB — BASIC METABOLIC PANEL
ANION GAP: 8 (ref 5–15)
BUN: 12 mg/dL (ref 6–20)
CALCIUM: 8.9 mg/dL (ref 8.9–10.3)
CO2: 24 mmol/L (ref 22–32)
Chloride: 107 mmol/L (ref 101–111)
Creatinine, Ser: 0.73 mg/dL (ref 0.61–1.24)
Glucose, Bld: 93 mg/dL (ref 65–99)
Potassium: 3.9 mmol/L (ref 3.5–5.1)
SODIUM: 139 mmol/L (ref 135–145)

## 2015-02-11 LAB — HOMOCYSTEINE: Homocysteine: 7.6 umol/L (ref 0.0–15.0)

## 2015-02-11 LAB — HEPARIN LEVEL (UNFRACTIONATED): Heparin Unfractionated: 0.39 IU/mL (ref 0.30–0.70)

## 2015-02-11 LAB — GLUCOSE, CAPILLARY: Glucose-Capillary: 79 mg/dL (ref 65–99)

## 2015-02-11 MED ORDER — WARFARIN SODIUM 10 MG PO TABS
10.0000 mg | ORAL_TABLET | Freq: Once | ORAL | Status: AC
  Administered 2015-02-11: 10 mg via ORAL
  Filled 2015-02-11: qty 1

## 2015-02-11 MED ORDER — TRAZODONE HCL 50 MG PO TABS
150.0000 mg | ORAL_TABLET | Freq: Every evening | ORAL | Status: DC | PRN
  Administered 2015-02-11: 150 mg via ORAL
  Filled 2015-02-11: qty 3

## 2015-02-11 MED ORDER — TRAMADOL HCL 50 MG PO TABS
100.0000 mg | ORAL_TABLET | Freq: Four times a day (QID) | ORAL | Status: DC | PRN
  Administered 2015-02-11 – 2015-02-13 (×6): 100 mg via ORAL
  Filled 2015-02-11 (×7): qty 2

## 2015-02-11 NOTE — Progress Notes (Addendum)
ANTICOAGULATION CONSULT NOTE - Follow Up Consult  Pharmacy Consult for heparin IV, warfarin Indication: suspected pulmonary embolus per CTA and new BL LE DVTs  No Known Allergies  Patient Measurements: Height: 5\' 10"  (177.8 cm) Weight: 217 lb 13 oz (98.8 kg) IBW/kg (Calculated) : 73 Heparin Dosing Weight: 93 kg  Vital Signs: Temp: 98.5 F (36.9 C) (12/05 0512) Temp Source: Oral (12/05 0512) BP: 109/73 mmHg (12/05 0512) Pulse Rate: 95 (12/05 0512)  Labs:  Recent Labs  02/09/15 0045 02/09/15 0750  02/10/15 0156 02/10/15 1040 02/10/15 1650 02/11/15 0457  HGB 14.9 12.5*  --  11.5*  --   --  11.4*  HCT 44.6 39.2  --  34.8*  --   --  35.5*  PLT 331 281  --  257  --   --  294  APTT 30  --   --   --   --   --   --   LABPROT 14.5  --   --   --   --   --  15.4*  INR 1.11  --   --   --   --   --  1.20  HEPARINUNFRC  --   --   < > 0.29* 0.36 0.34 0.39  CREATININE 0.99 0.89  --   --   --   --  0.73  < > = values in this interval not displayed.  Estimated Creatinine Clearance: 128.7 mL/min (by C-G formula based on Cr of 0.73).   Assessment: Patient is a 49 y.o M with recent L tibial fracture who presented to the ED on 12/3 with c/o SOB and CP.  Chest CTA with suspicion for BL LL PE.  LE doppler on 12/04 showed bilateral LE DVTs.  Heparin drip started for PE/DVT. Pharmacy asked to start warfarin 12/4.  Today, 02/11/2015: - Heparin level therapeutic at 0.39 on heparin 1950 units/hr - INR slightly increased 1.1 ==> 1.2 after first dose of warfarin 10mg  on 12/4 - Hgb and plt low but stable - No bleeding documented - Drug-drug interactions with warfarin - none currently (on PRN IV toradol) - Tolerating regular diet  Goal of Therapy:  Heparin level 0.3-0.7 units/ml  INR 2-3 Monitor platelets by anticoagulation protocol: Yes   Plan:  Day #2 VTE overlap therapy for acute VTE - Continue heparin drip to 1950 units/hr - Repeat warfarin 10mg  PO x 1  - Daily INR  - Educated  patient regarding warfarin indication, adverse effects, importance of INR monitoring, physician f/u appointments, food & drug interactions. He demonstrated excellent understanding.   Loralee PacasErin Adelle Zachar, PharmD, BCPS Pager: [phone]  02/11/2015 8:53 AM

## 2015-02-11 NOTE — Progress Notes (Signed)
[facility] PROGRESS NOTE  Nicholas Conway:[id number] DOB: 1965-04-21 DOA: 02/09/2015 PCP: Gwen PoundsUSSO,Aking M, MD  Assessment/Plan: #1 pulmonary emboli/extensive bilateral DVT Questionable etiology. Patient with recent tibial plateau fracture. Hypercoagulable panel pending. 2-D echo with no right ventricular strain. Bilateral lower extremity Dopplers consistent with extensive bilateral DVT. Continue IV heparin bridge with coumadin which was started yesterday. Goal INR 2-3. Pain management. Supportive care. Follow.  #2 left tibial plateau fracture Pain management. Outpatient follow-up with orthopedics.  #3 hypokalemia Repleted.  #4 leukocytosis Likely reactive leukocytosis. WBC trending down. Follow.  #5 QT prolongation Repeat EKG. Follow.  #6 prophylaxis Heparin for DVT prophylaxis.   Code Status: Full Family Communication: Updated patient. No family at bedside. Disposition Plan: Home when medically stable.   Consultants:  None  Procedures:  CT chest 02/09/2015  2-D echo 02/09/2015  Bilateral lower extremity Dopplers 02/10/2015  Chest x-ray 02/09/2015  Antibiotics:  None  HPI/Subjective: Patient states shortness of breath improved. Patient states pleuritic chest pain improved. No lower extremity pain.  Objective: Filed Vitals:   02/10/15 2054 02/11/15 0512  BP: 127/76 109/73  Pulse: 111 95  Temp: 98.4 F (36.9 C) 98.5 F (36.9 C)  Resp: 20 20    Intake/Output Summary (Last 24 hours) at 02/11/15 1225 Last data filed at 02/11/15 1142  Gross per 24 hour  Intake   1200 ml  Output   1600 ml  Net   -400 ml   Filed Weights   02/09/15 0339 02/10/15 0700  Weight: 98.5 kg (217 lb 2.5 oz) 98.8 kg (217 lb 13 oz)    Exam:   General:  NAD  Cardiovascular: RRR  Respiratory: CTAB  Abdomen: Soft, nontender, nondistended, positive bowel sounds  Musculoskeletal: No clubbing cyanosis or edema. Left lower extremity in a CAM boot.  Data  Reviewed: Basic Metabolic Panel:  Recent Labs Lab 02/09/15 0039 02/09/15 0045 02/09/15 0750 02/11/15 0457  NA 139 138 138 139  K 3.4* 3.4* 3.9 3.9  CL 101 102 106 107  CO2  --  24 25 24   GLUCOSE 119* 120* 99 93  BUN 13 14 11 12   CREATININE 0.90 0.99 0.89 0.73  CALCIUM  --  9.4 8.6* 8.9   Liver Function Tests:  Recent Labs Lab 02/09/15 0750  AST 20  ALT 27  ALKPHOS 99  BILITOT 0.8  PROT 7.0  ALBUMIN 3.3*   No results for input(s): LIPASE, AMYLASE in the last 168 hours. No results for input(s): AMMONIA in the last 168 hours. CBC:  Recent Labs Lab 02/09/15 0039 02/09/15 0045 02/09/15 0750 02/10/15 0156 02/11/15 0457  WBC  --  13.7* 10.9* 9.3 6.4  HGB 16.7 14.9 12.5* 11.5* 11.4*  HCT 49.0 44.6 39.2 34.8* 35.5*  MCV  --  86.8 88.1 86.4 87.2  PLT  --  331 281 257 294   Cardiac Enzymes: No results for input(s): CKTOTAL, CKMB, CKMBINDEX, TROPONINI in the last 168 hours. BNP (last 3 results) No results for input(s): BNP in the last 8760 hours.  ProBNP (last 3 results) No results for input(s): PROBNP in the last 8760 hours.  CBG:  Recent Labs Lab 02/10/15 0809 02/10/15 0911 02/11/15 0812  GLUCAP 62* 88 79    Recent Results (from the past 240 hour(s))  Culture, Urine     Status: None (Preliminary result)   Collection Time: 02/09/15  8:29 AM  Result Value Ref Range Status   Specimen Description URINE, CLEAN CATCH  Final   Special Requests NONE  Final   Culture   Final    NO GROWTH < 24 HOURS Performed at [facility]    Report Status PENDING  Incomplete     Studies: No results found.  Scheduled Meds: . sodium chloride  3 mL Intravenous Q12H  . warfarin  10 mg Oral ONCE-1800  . warfarin   Does not apply Once  . Warfarin - Pharmacist Dosing Inpatient   Does not apply q1800   Continuous Infusions: . heparin 1,950 Units/hr (02/10/15 1756)    Principal Problem:   Pulmonary embolism (HCC) Active Problems:   DVT of lower extremity,  bilateral (HCC)   Tibial plateau fracture, left   Hypokalemia   Leukocytosis   QT prolongation    Time spent: 35 mins    [facility] MD [facility] Pager [phone]. If 7PM-7AM, please contact night-coverage at [email or web address], password [facility] 02/11/2015, 12:25 PM  LOS: 2 days

## 2015-02-11 NOTE — Discharge Instructions (Signed)

## 2015-02-12 DIAGNOSIS — R000 Tachycardia, unspecified: Secondary | ICD-10-CM | POA: Clinically undetermined

## 2015-02-12 LAB — GLUCOSE, CAPILLARY: GLUCOSE-CAPILLARY: 87 mg/dL (ref 65–99)

## 2015-02-12 LAB — BETA-2-GLYCOPROTEIN I ABS, IGG/M/A: Beta-2 Glyco I IgG: <9 GPI IgG units (ref 0–20)

## 2015-02-12 LAB — CARDIOLIPIN ANTIBODIES, IGG, IGM, IGA

## 2015-02-12 LAB — CBC
HCT: 35.5 % — ABNORMAL LOW (ref 39.0–52.0)
HEMOGLOBIN: 11.5 g/dL — AB (ref 13.0–17.0)
MCH: 28.1 pg (ref 26.0–34.0)
MCHC: 32.4 g/dL (ref 30.0–36.0)
MCV: 86.8 fL (ref 78.0–100.0)
PLATELETS: 325 10*3/uL (ref 150–400)
RBC: 4.09 MIL/uL — ABNORMAL LOW (ref 4.22–5.81)
RDW: 13.3 % (ref 11.5–15.5)
WBC: 6.1 10*3/uL (ref 4.0–10.5)

## 2015-02-12 LAB — BASIC METABOLIC PANEL
Anion gap: 9 (ref 5–15)
BUN: 10 mg/dL (ref 6–20)
CHLORIDE: 107 mmol/L (ref 101–111)
CO2: 25 mmol/L (ref 22–32)
CREATININE: 0.87 mg/dL (ref 0.61–1.24)
Calcium: 8.9 mg/dL (ref 8.9–10.3)
GFR calc non Af Amer: >60 mL/min (ref >60)
Glucose, Bld: 92 mg/dL (ref 65–99)
POTASSIUM: 3.8 mmol/L (ref 3.5–5.1)
Sodium: 141 mmol/L (ref 135–145)

## 2015-02-12 LAB — HEPARIN LEVEL (UNFRACTIONATED): HEPARIN UNFRACTIONATED: 0.54 [IU]/mL (ref 0.30–0.70)

## 2015-02-12 LAB — PROTIME-INR
INR: 1.25 (ref 0.00–1.49)
Prothrombin Time: 15.9 seconds — ABNORMAL HIGH (ref 11.6–15.2)

## 2015-02-12 LAB — PROTEIN C, TOTAL: Protein C, Total: 90 % (ref 60–150)

## 2015-02-12 MED ORDER — ENOXAPARIN (LOVENOX) PATIENT EDUCATION KIT
PACK | Freq: Once | Status: AC
  Administered 2015-02-12: 09:00:00
  Filled 2015-02-12: qty 1

## 2015-02-12 MED ORDER — WARFARIN SODIUM 7.5 MG PO TABS
15.0000 mg | ORAL_TABLET | Freq: Once | ORAL | Status: AC
  Administered 2015-02-12: 15 mg via ORAL
  Filled 2015-02-12: qty 2

## 2015-02-12 MED ORDER — ZOLPIDEM TARTRATE 5 MG PO TABS
5.0000 mg | ORAL_TABLET | Freq: Every evening | ORAL | Status: DC | PRN
  Administered 2015-02-13 (×2): 5 mg via ORAL
  Filled 2015-02-12 (×2): qty 1

## 2015-02-12 MED ORDER — ENOXAPARIN SODIUM 100 MG/ML ~~LOC~~ SOLN
100.0000 mg | Freq: Two times a day (BID) | SUBCUTANEOUS | Status: DC
  Administered 2015-02-12 – 2015-02-14 (×5): 100 mg via SUBCUTANEOUS
  Filled 2015-02-12 (×6): qty 1

## 2015-02-12 NOTE — Progress Notes (Signed)
[facility] PROGRESS NOTE  Nicholas Conway JXB:[id number] DOB: 02-16-1966 DOA: 02/09/2015 PCP: Gwen PoundsUSSO,Alonzo M, MD  Assessment/Plan: #1 pulmonary emboli/extensive bilateral DVT Questionable etiology. Patient with recent tibial plateau fracture. Hypercoagulable panel pending. 2-D echo with no right ventricular strain. Bilateral lower extremity Dopplers consistent with extensive bilateral DVT. Change IV heparin bridge to full dose Lovenox bridge with coumadin. Goal INR 2-3. INR today is 1.25. Pain management. Supportive care. Follow.  #2 left tibial plateau fracture Pain management. Outpatient follow-up with orthopedics.  #3 hypokalemia Repleted.  #4 leukocytosis Likely reactive leukocytosis. Leukocytosis resolved. WBC trending down. Follow.  #5 QT prolongation Repeat EKG. Follow.  #6 sinus tachycardia Patient on ambulation noted to have a heart rate in the 150s while working with PT. Heart rate improved with rest. May be secondary to problem #1. Check a TSH. Check a magnesium level. Follow.  #7 prophylaxis Lovenox for DVT prophylaxis.   Code Status: Full Family Communication: Updated patient. No family at bedside. Disposition Plan: Patient to be transitioned to Lovenox bridge with Coumadin today. Patient's heart rate was in the 150s on ambulation with PT. If heart rate improves on ambulation and patient is stable could possibly discharge in the next one to 2 days.   Consultants:  None  Procedures:  CT chest 02/09/2015  2-D echo 02/09/2015  Bilateral lower extremity Dopplers 02/10/2015  Chest x-ray 02/09/2015  Antibiotics:  None  HPI/Subjective: Patient states shortness of breath improved. Patient states chest pain is improving. Patient noted increasing heart rate on ambulation with PT in the 150s which improved on rest.  Objective: Filed Vitals:   02/12/15 1045 02/12/15 1412  BP:  105/64  Pulse: 154 88  Temp:  97.9 F (36.6 C)  Resp:  20     Intake/Output Summary (Last 24 hours) at 02/12/15 1445 Last data filed at 02/12/15 1218  Gross per 24 hour  Intake    480 ml  Output    900 ml  Net   -420 ml   Filed Weights   02/09/15 0339 02/10/15 0700  Weight: 98.5 kg (217 lb 2.5 oz) 98.8 kg (217 lb 13 oz)    Exam:   General:  NAD  Cardiovascular: RRR  Respiratory: CTAB  Abdomen: Soft, nontender, nondistended, positive bowel sounds  Musculoskeletal: No clubbing cyanosis or edema. Left lower extremity in a CAM boot.  Data Reviewed: Basic Metabolic Panel:  Recent Labs Lab 02/09/15 0039 02/09/15 0045 02/09/15 0750 02/11/15 0457 02/12/15 0520  NA 139 138 138 139 141  K 3.4* 3.4* 3.9 3.9 3.8  CL 101 102 106 107 107  CO2  --  24 25 24 25   GLUCOSE 119* 120* 99 93 92  BUN 13 14 11 12 10   CREATININE 0.90 0.99 0.89 0.73 0.87  CALCIUM  --  9.4 8.6* 8.9 8.9   Liver Function Tests:  Recent Labs Lab 02/09/15 0750  AST 20  ALT 27  ALKPHOS 99  BILITOT 0.8  PROT 7.0  ALBUMIN 3.3*   No results for input(s): LIPASE, AMYLASE in the last 168 hours. No results for input(s): AMMONIA in the last 168 hours. CBC:  Recent Labs Lab 02/09/15 0045 02/09/15 0750 02/10/15 0156 02/11/15 0457 02/12/15 0520  WBC 13.7* 10.9* 9.3 6.4 6.1  HGB 14.9 12.5* 11.5* 11.4* 11.5*  HCT 44.6 39.2 34.8* 35.5* 35.5*  MCV 86.8 88.1 86.4 87.2 86.8  PLT 331 281 257 294 325   Cardiac Enzymes: No results for input(s): CKTOTAL, CKMB, CKMBINDEX, TROPONINI in the last  168 hours. BNP (last 3 results) No results for input(s): BNP in the last 8760 hours.  ProBNP (last 3 results) No results for input(s): PROBNP in the last 8760 hours.  CBG:  Recent Labs Lab 02/10/15 0809 02/10/15 0911 02/11/15 0812 02/12/15 0736  GLUCAP 62* 88 79 87    Recent Results (from the past 240 hour(s))  Culture, Urine     Status: None   Collection Time: 02/09/15  8:29 AM  Result Value Ref Range Status   Specimen Description URINE, CLEAN CATCH   Final   Special Requests NONE  Final   Culture   Final    2,000 COLONIES/mL INSIGNIFICANT GROWTH Performed at [facility]    Report Status 02/11/2015 FINAL  Final     Studies: No results found.  Scheduled Meds: . enoxaparin (LOVENOX) injection  100 mg Subcutaneous Q12H  . sodium chloride  3 mL Intravenous Q12H  . warfarin   Does not apply Once  . Warfarin - Pharmacist Dosing Inpatient   Does not apply q1800   Continuous Infusions:    Principal Problem:   Pulmonary embolism (HCC) Active Problems:   DVT of lower extremity, bilateral (HCC)   Tibial plateau fracture, left   Hypokalemia   Leukocytosis   QT prolongation   Sinus tachycardia (HCC)    Time spent: 35 mins    [facility] MD [facility] Pager [phone]. If 7PM-7AM, please contact night-coverage at [email or web address], password [facility] 02/12/2015, 2:45 PM  LOS: 3 days

## 2015-02-12 NOTE — Progress Notes (Signed)
Physical Therapy Treatment Patient Details Name: Nicholas Conway MRN: [id number] DOB: 04-13-1965 Today's Date: 02/12/2015    History of Present Illness Nicholas Conway is a 49 y.o. male with PMH of recent left tibial plateau fracture on splinter, distant hx of pneumothorax, who presents with shortness of breath and chest pain; pt + for PE and bil DVTs    PT Comments    Pt ambulated 20' with RW and min/guard assist. Distance limited by PT due to HR up to 154 with walking, pt asymptomatic. HR 127 after 1 min seated rest. HR 106 at rest prior to walking. RN aware.   Follow Up Recommendations  No PT follow up;Supervision - Intermittent (defer PT to ortho MD (Dr. Reece Conway) after next visit)     Equipment Recommendations  None recommended by PT    Recommendations for Other Services       Precautions / Restrictions Precautions Precautions: Fall Precaution Comments: monitor HR Required Braces or Orthoses: Knee Immobilizer - Left Knee Immobilizer - Left: On at all times Restrictions Weight Bearing Restrictions: Yes LLE Weight Bearing: Non weight bearing    Mobility  Bed Mobility Overal bed mobility: Modified Independent Bed Mobility: Supine to Sit     Supine to sit: Modified independent (Device/Increase time);HOB elevated        Transfers Overall transfer level: Needs assistance Equipment used: Rolling walker (2 wheeled) Transfers: Sit to/from Stand Sit to Stand: Min guard         General transfer comment: cues for hand placement and safety, min/guard for balance, pt had mild posterior lean upon standing but was able to self correct without physical assist  Ambulation/Gait Ambulation/Gait assistance: Min guard Ambulation Distance (Feet): 20 Feet Assistive device: Rolling walker (2 wheeled) Gait Pattern/deviations: Step-to pattern   Gait velocity interpretation: Below normal speed for age/gender General Gait Details: distance limited by PT due to HR 154 with walking, pt  asymptomatic, HR 127 after seated rest for 1 minute, RN aware, pt maintains NWB well   Conway            Wheelchair Mobility    Modified Rankin (Stroke Patients Only)       Balance           Standing balance support: Bilateral upper extremity supported Standing balance-Leahy Scale: Poor Standing balance comment: relies on BUE support due to NWB status                    Cognition Arousal/Alertness: Awake/alert Behavior During Therapy: WFL for tasks assessed/performed Overall Cognitive Status: Within Functional Limits for tasks assessed                      Exercises      General Comments        Pertinent Vitals/Pain Pain Assessment: No/denies pain    Home Living                      Prior Function            PT Goals (current goals can now be found in the care plan section) Acute Rehab PT Goals Patient Stated Goal: 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 [employer] PT Goal Formulation: With patient Time For Goal Achievement: 02/17/15 Potential to Achieve Goals: Good Progress towards PT goals: Not progressing toward goals - comment (due to elevated HR with activity)    Frequency  Min 3X/week    PT Plan Current plan  remains appropriate    Co-evaluation             End of Session Equipment Utilized During Treatment: Gait belt;Left knee immobilizer Activity Tolerance: Patient tolerated treatment well;No increased pain;Treatment limited secondary to medical complications (Comment) Patient left: in chair;with call bell/phone within reach;with chair alarm set     Time: [id number] PT Time Calculation (min) (ACUTE ONLY): 18 min  Charges:  $Gait Training: 8-22 mins                    G Codes:      Nicholas Conway, Nicholas Conway 02/12/2015, 10:50 AM [phone]

## 2015-02-12 NOTE — Progress Notes (Signed)
ANTICOAGULATION CONSULT NOTE - Follow Up Consult  Pharmacy Consult for heparin IV, warfarin Indication: suspected pulmonary embolus per CTA and new BL LE DVTs  No Known Allergies  Patient Measurements: Height: 5\' 10"  (177.8 cm) Weight: 217 lb 13 oz (98.8 kg) IBW/kg (Calculated) : 73 Heparin Dosing Weight: 93 kg  Vital Signs: Temp: 98 F (36.7 C) (12/06 0527) Temp Source: Oral (12/06 0527) BP: 119/77 mmHg (12/06 0527) Pulse Rate: 92 (12/06 0527)  Labs:  Recent Labs  02/10/15 0156  02/10/15 1650 02/11/15 0457 02/12/15 0520  HGB 11.5*  --   --  11.4* 11.5*  HCT 34.8*  --   --  35.5* 35.5*  PLT 257  --   --  294 325  LABPROT  --   --   --  15.4* 15.9*  INR  --   --   --  1.20 1.25  HEPARINUNFRC 0.29*  < > 0.34 0.39 0.54  CREATININE  --   --   --  0.73 0.87  < > = values in this interval not displayed.  Estimated Creatinine Clearance: 118.4 mL/min (by C-G formula based on Cr of 0.87).   Assessment: Patient is a 49 y.o M with recent L tibial fracture who presented to the ED on 12/3 with c/o SOB and CP.  Chest CTA with suspicion for BL LL PE.  LE doppler on 12/04 showed bilateral LE DVTs.  Heparin drip started for PE/DVT. Pharmacy asked to start warfarin 12/4.  Today, 02/12/2015: - Heparin level therapeutic at 0.54 on heparin 1950 units/hr - INR slightly increased 1.25 after two doses of warfarin 10mg  on 12/4 and 12/5 - Hgb and plt stable - No bleeding documented - Drug-drug interactions with warfarin - none currently (on PRN IV toradol) - Tolerating regular diet  Goal of Therapy:  Heparin level 0.3-0.7 units/ml  INR 2-3 Monitor platelets by anticoagulation protocol: Yes   Plan:  Day #3 VTE overlap therapy for acute VTE - Continue heparin drip to 1950 units/hr - Increase warfarin to 15mg  PO x 1 and give dose early at 12 noon  - Daily INR  - If transition to Lovenox today, recommend 100mg  SQ q12h   Loralee PacasErin Juandaniel Manfredo, PharmD, BCPS Pager: [phone]  02/12/2015 8:08  AM

## 2015-02-13 DIAGNOSIS — D72829 Elevated white blood cell count, unspecified: Secondary | ICD-10-CM

## 2015-02-13 DIAGNOSIS — E876 Hypokalemia: Secondary | ICD-10-CM

## 2015-02-13 DIAGNOSIS — R000 Tachycardia, unspecified: Secondary | ICD-10-CM

## 2015-02-13 DIAGNOSIS — I82403 Acute embolism and thrombosis of unspecified deep veins of lower extremity, bilateral: Secondary | ICD-10-CM

## 2015-02-13 LAB — BASIC METABOLIC PANEL
Anion gap: 9 (ref 5–15)
BUN: 15 mg/dL (ref 6–20)
CO2: 26 mmol/L (ref 22–32)
CREATININE: 0.92 mg/dL (ref 0.61–1.24)
Calcium: 9.1 mg/dL (ref 8.9–10.3)
Chloride: 104 mmol/L (ref 101–111)
GFR calc Af Amer: >60 mL/min (ref >60)
GLUCOSE: 89 mg/dL (ref 65–99)
Potassium: 4.1 mmol/L (ref 3.5–5.1)
SODIUM: 139 mmol/L (ref 135–145)

## 2015-02-13 LAB — CBC
HCT: 36.8 % — ABNORMAL LOW (ref 39.0–52.0)
Hemoglobin: 11.7 g/dL — ABNORMAL LOW (ref 13.0–17.0)
MCH: 27.7 pg (ref 26.0–34.0)
MCHC: 31.8 g/dL (ref 30.0–36.0)
MCV: 87.2 fL (ref 78.0–100.0)
PLATELETS: 335 10*3/uL (ref 150–400)
RBC: 4.22 MIL/uL (ref 4.22–5.81)
RDW: 13.4 % (ref 11.5–15.5)
WBC: 5.6 10*3/uL (ref 4.0–10.5)

## 2015-02-13 LAB — GLUCOSE, CAPILLARY: GLUCOSE-CAPILLARY: 73 mg/dL (ref 65–99)

## 2015-02-13 LAB — TSH: TSH: 7.877 u[IU]/mL — AB (ref 0.350–4.500)

## 2015-02-13 LAB — PROTIME-INR
INR: 1.97 — AB (ref 0.00–1.49)
Prothrombin Time: 22.3 seconds — ABNORMAL HIGH (ref 11.6–15.2)

## 2015-02-13 LAB — FACTOR 5 LEIDEN

## 2015-02-13 LAB — MAGNESIUM: MAGNESIUM: 2.2 mg/dL (ref 1.7–2.4)

## 2015-02-13 MED ORDER — WARFARIN SODIUM 7.5 MG PO TABS
7.5000 mg | ORAL_TABLET | Freq: Once | ORAL | Status: AC
  Administered 2015-02-13: 7.5 mg via ORAL
  Filled 2015-02-13: qty 1

## 2015-02-13 NOTE — Progress Notes (Signed)
[facility] PROGRESS NOTE  Nicholas Conway:[id number] DOB: Mar 15, 1965 DOA: 02/09/2015 PCP: Gwen Pounds, MD  HPI/Brief narrative 49 y.o. male with PMH of recent left tibial plateau fracture on splinter, distant hx of pneumothorax, who presents with shortness of breath and chest pain. Patient was found to have acute PE after prolonged immobility following tibial plateau fracture. Patient was admitted for further work up.  Assessment/Plan: #1 pulmonary emboli/extensive bilateral DVT Patient with recent tibial plateau fracture with resultant prolonged immobility. Hypercoagulable panel pending. 2-D echo with no right ventricular strain. Bilateral lower extremity Dopplers consistent with extensive bilateral DVT. Change IV heparin bridge to full dose Lovenox bridge with coumadin. Goal INR 2-3. INR today is 1.9. Pain management. Supportive care. Follow. Of note, did discuss case with IR who recommends repeat LE dopplers and if unchanged or worse, consider IVC filter. Would therefore repeat LE dopplers in 2-3 weeks as outpatient.  #2 left tibial plateau fracture Pain management. Outpatient follow-up with orthopedics.  #3 hypokalemia Replaced.  #4 leukocytosis Likely reactive leukocytosis. Leukocytosis resolved. WBC trending down  #5 QT prolongation Repeat EKG. Follow.  #6 sinus tachycardia Patient on ambulation noted to have a heart rate in the 150s while working with PT. Heart rate improved with rest. Likely secondary to PE. TSH 7.87. Will check free t4 in AM.  Code Status: Full Family Communication: Pt in room Disposition Plan: Plan d/c home when INR therapeutic, likely 12/8   Consultants:    Procedures:    Antibiotics: Anti-infectives    None      HPI/Subjective: No complaints this AM  Objective: Filed Vitals:   02/12/15 1412 02/12/15 2104 02/13/15 0533 02/13/15 1402  BP: 105/64 98/68 111/79 111/84  Pulse: 88 95 78 108  Temp: 97.9 F (36.6 C) 97.9 F (36.6  C) 98 F (36.7 C) 98.2 F (36.8 C)  TempSrc: Oral Oral Oral Oral  Resp: Height:      Weight:      SpO2: 98% 96% 96% 97%    Intake/Output Summary (Last 24 hours) at 02/13/15 1720 Last data filed at 02/13/15 1300  Gross per 24 hour  Intake    720 ml  Output   1325 ml  Net   -605 ml   Filed Weights   02/09/15 0339 02/10/15 0700  Weight: 98.5 kg (217 lb 2.5 oz) 98.8 kg (217 lb 13 oz)    Exam:   General:  Awake, in nad  Cardiovascular: regular, s1, s2  Respiratory: normal resp effort, no wheezing  Abdomen: soft,nondistended  Musculoskeletal: perfused, no clubbing   Data Reviewed: Basic Metabolic Panel:  Recent Labs Lab 02/09/15 0045 02/09/15 0750 02/11/15 0457 02/12/15 0520 02/13/15 0527  NA 138 138 139 141 139  K 3.4* 3.9 3.9 3.8 4.1  CL 102 106 107 107 104  CO2 GLUCOSE 120* 99 93 92 89  BUN CREATININE 0.99 0.89 0.73 0.87 0.92  CALCIUM 9.4 8.6* 8.9 8.9 9.1  MG  --   --   --   --  2.2   Liver Function Tests:  Recent Labs Lab 02/09/15 0750  AST 20  ALT 27  ALKPHOS 99  BILITOT 0.8  PROT 7.0  ALBUMIN 3.3*   No results for input(s): LIPASE, AMYLASE in the last 168 hours. No results for input(s): AMMONIA in the last 168 hours. CBC:  Recent Labs Lab 02/09/15 0750 02/10/15 0156 02/11/15 0457 02/12/15 0520  02/13/15 0527  WBC 10.9* 9.3 6.4 6.1 5.6  HGB 12.5* 11.5* 11.4* 11.5* 11.7*  HCT 39.2 34.8* 35.5* 35.5* 36.8*  MCV 88.1 86.4 87.2 86.8 87.2  PLT 281 257 294 325 335   Cardiac Enzymes: No results for input(s): CKTOTAL, CKMB, CKMBINDEX, TROPONINI in the last 168 hours. BNP (last 3 results) No results for input(s): BNP in the last 8760 hours.  ProBNP (last 3 results) No results for input(s): PROBNP in the last 8760 hours.  CBG:  Recent Labs Lab 02/10/15 0809 02/10/15 0911 02/11/15 0812 02/12/15 0736 02/13/15 0736  GLUCAP 62* 88 79 87 73    Recent Results (from the past 240 hour(s))   Culture, Urine     Status: None   Collection Time: 02/09/15  8:29 AM  Result Value Ref Range Status   Specimen Description URINE, CLEAN CATCH  Final   Special Requests NONE  Final   Culture   Final    2,000 COLONIES/mL INSIGNIFICANT GROWTH [facility]    Report Status 02/11/2015 FINAL  Final     Studies: No results found.  Scheduled Meds: . enoxaparin (LOVENOX) injection  100 mg Subcutaneous Q12H  . sodium chloride  3 mL Intravenous Q12H  . warfarin  7.5 mg Oral ONCE-1800  . warfarin   Does not apply Once  . Warfarin - Pharmacist Dosing Inpatient   Does not apply q1800   Continuous Infusions:   Principal Problem:   Pulmonary embolism (HCC) Active Problems:   Tibial plateau fracture, left   Hypokalemia   Leukocytosis   QT prolongation   DVT of lower extremity, bilateral (HCC)   Sinus tachycardia (HCC)    [name]  [facility] Pager [phone]. If 7PM-7AM, please contact night-coverage at [email or web address], password [email or web address] 02/13/2015, 5:20 PM  LOS: 4 days

## 2015-02-13 NOTE — Progress Notes (Signed)
OT Cancellation Note  Patient Details Name: Nicholas PinkJohn A Conway MRN: [id number] DOB: Jul 12, 1965   Cancelled Treatment:    Reason Eval/Treat Not Completed: Other (comment).  Pt just back to bed and eating lunch.  Will reattempt tomorrow.  [name] 02/13/2015, 1:52 PM  [name], OTR/L [phone] 02/13/2015

## 2015-02-13 NOTE — Progress Notes (Signed)
OT Cancellation Note  Patient Details Name: Nicholas Conway MRN: [id number] DOB: April 09, 1965   Cancelled Treatment:    Reason Eval/Treat Not Completed: Other (comment).  Pt is letting leg breathe (out of KI); will try to check back this pm.  Kaneisha Ellenberger 02/13/2015, 10:35 AM  Marica OtterMaryellen Shelle Galdamez, OTR/L [phone] 02/13/2015

## 2015-02-13 NOTE — Progress Notes (Signed)
ANTICOAGULATION CONSULT NOTE - Follow Up Consult  Pharmacy Consult for Lovenox, warfarin Indication: suspected pulmonary embolus per CTA and new BL LE DVTs  No Known Allergies  Patient Measurements: Height: 5\' 10"  (177.8 cm) Weight: 217 lb 13 oz (98.8 kg) IBW/kg (Calculated) : 73 Heparin Dosing Weight: 93 kg  Vital Signs: Temp: 98 F (36.7 C) (12/07 0533) Temp Source: Oral (12/07 0533) BP: 111/79 mmHg (12/07 0533) Pulse Rate: 78 (12/07 0533)  Labs:  Recent Labs  02/10/15 1650  02/11/15 0457 02/12/15 0520 02/13/15 0527  HGB  --   < > 11.4* 11.5* 11.7*  HCT  --   --  35.5* 35.5* 36.8*  PLT  --   --  294 325 335  LABPROT  --   --  15.4* 15.9* 22.3*  INR  --   --  1.20 1.25 1.97*  HEPARINUNFRC 0.34  --  0.39 0.54  --   CREATININE  --   --  0.73 0.87 0.92  < > = values in this interval not displayed.  Estimated Creatinine Clearance: 111.9 mL/min (by C-G formula based on Cr of 0.92).   Assessment: Patient is a 49 y.o M with recent L tibial fracture who presented to the ED on 12/3 with c/o SOB and CP.  Chest CTA with suspicion for BL LL PE.  LE doppler on 12/04 showed bilateral LE DVTs.  Heparin drip started for PE/DVT, transitioned to Lovenox 12/6. Pharmacy asked to start warfarin 12/4  Today, 02/13/2015: - INR now responding significantly (1.97) after three doses of warfarin 10mg , 10mg , 15mg  (12/4-12/6) - Hgb and plt stable - SCr stable, CrCl > 100 ml/min - No bleeding documented - Drug-drug interactions with warfarin - none currently (on PRN IV toradol) - Tolerating regular diet  Goal of Therapy:  INR 2-3 Monitor platelets by anticoagulation protocol: Yes   Plan:  Day #4 VTE overlap therapy for acute VTE - Continue lovenox 1mg /kg sq q12h - Warfarin to 7.5mg  PO x 1  - Daily INR  - Warfarin education completed 12/5  Loralee PacasErin Glenda Kunst, PharmD, BCPS Pager: [phone]  02/13/2015 9:34 AM

## 2015-02-14 DIAGNOSIS — S82142A Displaced bicondylar fracture of left tibia, initial encounter for closed fracture: Secondary | ICD-10-CM

## 2015-02-14 LAB — CBC
HEMATOCRIT: 36.8 % — AB (ref 39.0–52.0)
Hemoglobin: 11.9 g/dL — ABNORMAL LOW (ref 13.0–17.0)
MCH: 27.7 pg (ref 26.0–34.0)
MCHC: 32.3 g/dL (ref 30.0–36.0)
MCV: 85.8 fL (ref 78.0–100.0)
Platelets: 350 10*3/uL (ref 150–400)
RBC: 4.29 MIL/uL (ref 4.22–5.81)
RDW: 13.3 % (ref 11.5–15.5)
WBC: 6.2 10*3/uL (ref 4.0–10.5)

## 2015-02-14 LAB — PROTIME-INR
INR: 2.24 — ABNORMAL HIGH (ref 0.00–1.49)
Prothrombin Time: 24.6 seconds — ABNORMAL HIGH (ref 11.6–15.2)

## 2015-02-14 LAB — T4, FREE: FREE T4: 0.79 ng/dL (ref 0.61–1.12)

## 2015-02-14 LAB — PROTHROMBIN GENE MUTATION

## 2015-02-14 LAB — GLUCOSE, CAPILLARY: Glucose-Capillary: 72 mg/dL (ref 65–99)

## 2015-02-14 MED ORDER — WARFARIN SODIUM 5 MG PO TABS
5.0000 mg | ORAL_TABLET | Freq: Once | ORAL | Status: DC
  Filled 2015-02-14: qty 1

## 2015-02-14 MED ORDER — ZOLPIDEM TARTRATE 5 MG PO TABS: 5.0000 mg | ORAL_TABLET | Freq: Every evening | ORAL | Status: AC | PRN

## 2015-02-14 MED ORDER — TRAMADOL HCL 50 MG PO TABS: 100.0000 mg | ORAL_TABLET | Freq: Four times a day (QID) | ORAL | Status: DC | PRN

## 2015-02-14 MED ORDER — WARFARIN SODIUM 5 MG PO TABS: 5.0000 mg | ORAL_TABLET | Freq: Once | ORAL | Status: DC

## 2015-02-14 NOTE — Progress Notes (Signed)
OT Cancellation Note  Patient Details Name: Nicholas Conway A Mullin MRN: [id number] DOB: 1965-10-30   Cancelled Treatment:    Reason Eval/Treat Not Completed: Other (comment)   Pt plans dc today  Feels comfortable with toilet transfers and adls  No follow up OT needed.  Tracker Mance 02/14/2015, 3:05 PM  Marica OtterMaryellen Ravneet Spilker, OTR/L [phone] 02/14/2015

## 2015-02-14 NOTE — Discharge Summary (Signed)
Physician Discharge Summary  [place] PinkJohn A Basham OZH:[id number] DOB: [birth year] DOA: 02/09/2015  PCP: Gwen PoundsUSSO,Osher M, MD  Admit date: 02/09/2015 Discharge date: 02/14/2015  Time spent: 20 minutes  Recommendations for Outpatient Follow-up:  1. Follow up with PCP in 1-2 weeks 2. Please repeat INR within 3-5 days and adjust coumadin dose accordingly 3. Follow up with Orthopedic Surgery as scheduled  4. Per IR, recommend repeat LE dopplers in 2-3 weeks. If DVT worsening, consider placement of IVC filter at that time  Discharge Diagnoses:  Principal Problem:   Pulmonary embolism (HCC) Active Problems:   Tibial plateau fracture, left   Hypokalemia   Leukocytosis   QT prolongation   DVT of lower extremity, bilateral (HCC)   Sinus tachycardia (HCC)   Discharge Condition: Stable  Diet recommendation: Regular  Filed Weights   02/09/15 0339 02/10/15 0700  Weight: 98.5 kg (217 lb 2.5 oz) 98.8 kg (217 lb 13 oz)    History of present illness:  Please review dictated H and P from 12/7 for details. Briefly, 49 y.o. male with PMH of recent left tibial plateau fracture on splinter, distant hx of pneumothorax, who presents with shortness of breath and chest pain. Patient was found to have acute PE after prolonged immobility following tibial plateau fracture. Patient was admitted for further work up.  Hospital Course:  #1 pulmonary emboli/extensive bilateral DVT Patient with recent tibial plateau fracture with resultant prolonged immobility. Hypercoagulable panel pending. 2-D echo with no right ventricular strain. Bilateral lower extremity Dopplers consistent with extensive bilateral DVT. Change IV heparin bridge to full dose Lovenox bridge with coumadin. Goal INR 2-3. INR today is 1.9. Pain management. Supportive care. Follow. Of note, did discuss case with IR regarding ?thombolysis in this case. Per IR, recommendation for repeat LE dopplers and if unchanged or worse, consider IVC filter. Would therefore  repeat LE dopplers in 2-3 weeks as outpatient.  #2 left tibial plateau fracture Cont pain management. Outpatient follow-up with orthopedics.  #3 hypokalemia Replaced.  #4 leukocytosis Likely reactive leukocytosis. Leukocytosis resolved. WBC trending down  #5 QT prolongation Repeat EKG. Follow.  #6 sinus tachycardia Patient on ambulation noted to have a heart rate in the 150s while working with PT. Heart rate improved with rest. Likely secondary to PE. TSH 7.87, free t4 normal.  Discharge Exam: Filed Vitals:   02/13/15 2123 02/14/15 0432 02/14/15 1131 02/14/15 1347  BP: 107/77 100/79 139/89 115/72  Pulse: 86 86 161 90  Temp: 98 F (36.7 C) 97.9 F (36.6 C)  98.5 F (36.9 C)  TempSrc: Oral Oral  Oral  Resp: 20 20  18   Height:      Weight:      SpO2: 96% 94% 96% 96%    General: Awake, in nad Cardiovascular: regular, s1, s2 Respiratory: normal resp effort, no wheezing  Discharge Instructions     Medication List    STOP taking these medications        aspirin 325 MG tablet      TAKE these medications        acetaminophen 500 MG tablet  Commonly known as:  TYLENOL  Take 1,000 mg by mouth every 6 (six) hours as needed (for pain.).     diphenhydramine-acetaminophen 25-500 MG Tabs tablet  Commonly known as:  TYLENOL PM  Take 2 tablets by mouth at bedtime as needed (for pain/sleep).     HYDROcodone-acetaminophen 5-325 MG tablet  Commonly known as:  NORCO/VICODIN  Take 1 tablet by mouth 3 (three) times daily  as needed. For pain.     traMADol 50 MG tablet  Commonly known as:  ULTRAM  Take 2 tablets (100 mg total) by mouth every 6 (six) hours as needed for moderate pain.     warfarin 5 MG tablet  Commonly known as:  COUMADIN  Take 1 tablet (5 mg total) by mouth one time only at 6 PM.     zolpidem 5 MG tablet  Commonly known as:  AMBIEN  Take 1 tablet (5 mg total) by mouth at bedtime as needed for sleep.       No Known Allergies Follow-up Information     Follow up with Gwen Pounds, MD. Schedule an appointment as soon as possible for a visit in 1 week.   Specialty:  Internal Medicine   Why:  Hospital follow up   Contact information:   [address] [place] [address] [phone]       Follow up with Follow up with your Orthopedic Surgeon as scheduled.   Why:  Hospital follow up       The results of significant diagnostics from this hospitalization (including imaging, microbiology, ancillary and laboratory) are listed below for reference.    Significant Diagnostic Studies: Ct Angio Chest Pe W/cm &/or Wo Cm  02/09/2015  CLINICAL DATA:  49 year old male with pleuritic chest pain concern for pulmonary embolism. EXAM: CT ANGIOGRAPHY CHEST WITH CONTRAST TECHNIQUE: Multidetector CT imaging of the chest was performed using the standard protocol during bolus administration of intravenous contrast. Multiplanar CT image reconstructions and MIPs were obtained to evaluate the vascular anatomy. CONTRAST:  OMNIPAQUE IOHEXOL 350 MG/ML SOLN COMPARISON:  CT dated 06/24/2007 FINDINGS: There is a focal area of pleural based ground-glass opacity at the right lung base concerning for an area of pulmonary infarct. There is mild paraseptal emphysema. The remainder of the lungs are clear. The small calcified granuloma is noted in the right middle lobe. No pleural effusion. The central airways are patent. The thoracic aorta appears unremarkable. Evaluation of the pulmonary arteries is very limited due to suboptimal opacification. Repeat study with additional contrast bolus to better opacifying the pulmonary artery was not successful. There are apparent hypodensity and filling defects primarily involving the left lower lobe pulmonary artery as well as segmental and subsegmental branches of the right lower lobe suspicious for pulmonary emboli. Clinical correlation recommended. V/Q scan recommended for further evaluation. There is no cardiomegaly or pericardial  effusion. No evidence of right cardiac strain is identified. There is no hilar or mediastinal adenopathy. The visualized esophagus and thyroid gland appear grossly unremarkable. There is no axillary adenopathy. The chest wall soft tissues appear unremarkable. There is degenerative changes of the spine. No acute fracture. Review of the MIP images confirms the above findings. IMPRESSION: Suboptimal opacification of the pulmonary arteries. Findings are however is suspicious for bilateral lower lobe predominant pulmonary artery emboli. An area of ground-glass opacity at the right lung base likely represents focal pulmonary infarct. V/Q scan or anticoagulation treatment and follow-up with CT after 24-36 hours recommended. Critical Value/emergent results were called by telephone at the time of interpretation on 02/09/2015 at 2:12 am to Dr. Zadie Rhine , who verbally acknowledged these results. Electronically Signed   By: Elgie Collard M.D.   On: 02/09/2015 02:16   Dg Chest Portable 1 View  02/09/2015  CLINICAL DATA:  49 year old male with chest pain. History of pneumothorax EXAM: PORTABLE CHEST 1 VIEW COMPARISON:  Chest CT dated 06/24/2007 FINDINGS: The heart size and mediastinal  contours are within normal limits. Both lungs are clear. The visualized skeletal structures are unremarkable. IMPRESSION: No active disease. Electronically Signed   By: Elgie Collard M.D.   On: 02/09/2015 00:56    Microbiology: Recent Results (from the past 240 hour(s))  Culture, Urine     Status: None   Collection Time: 02/09/15  8:29 AM  Result Value Ref Range Status   Specimen Description URINE, CLEAN CATCH  Final   Special Requests NONE  Final   Culture   Final    2,000 COLONIES/mL INSIGNIFICANT GROWTH Performed at [facility]    Report Status 02/11/2015 FINAL  Final     Labs: Basic Metabolic Panel:  Recent Labs Lab 02/09/15 0045 02/09/15 0750 02/11/15 0457 02/12/15 0520 02/13/15 0527  NA 138 138  139 141 139  K 3.4* 3.9 3.9 3.8 4.1  CL 102 106 107 107 104  CO2 GLUCOSE 120* 99 93 92 89  BUN CREATININE 0.99 0.89 0.73 0.87 0.92  CALCIUM 9.4 8.6* 8.9 8.9 9.1  MG  --   --   --   --  2.2   Liver Function Tests:  Recent Labs Lab 02/09/15 0750  AST 20  ALT 27  ALKPHOS 99  BILITOT 0.8  PROT 7.0  ALBUMIN 3.3*   No results for input(s): LIPASE, AMYLASE in the last 168 hours. No results for input(s): AMMONIA in the last 168 hours. CBC:  Recent Labs Lab 02/10/15 0156 02/11/15 0457 02/12/15 0520 02/13/15 0527 02/14/15 0428  WBC 9.3 6.4 6.1 5.6 6.2  HGB 11.5* 11.4* 11.5* 11.7* 11.9*  HCT 34.8* 35.5* 35.5* 36.8* 36.8*  MCV 86.4 87.2 86.8 87.2 85.8  PLT 257 294 325 335 350   Cardiac Enzymes: No results for input(s): CKTOTAL, CKMB, CKMBINDEX, TROPONINI in the last 168 hours. BNP: BNP (last 3 results) No results for input(s): BNP in the last 8760 hours.  ProBNP (last 3 results) No results for input(s): PROBNP in the last 8760 hours.  CBG:  Recent Labs Lab 02/10/15 0911 02/11/15 0812 02/12/15 0736 02/13/15 0736 02/14/15 0720  GLUCAP 88 79 87 73 72    Signed:  Tylena Prisk K  [facility] 02/14/2015, 2:35 PM

## 2015-02-14 NOTE — Progress Notes (Signed)
ANTICOAGULATION CONSULT NOTE - Follow Up Consult  Pharmacy Consult for Lovenox, warfarin Indication: suspected pulmonary embolus per CTA and new BL LE DVTs  No Known Allergies  Patient Measurements: Height: 5\' 10"  (177.8 cm) Weight: 217 lb 13 oz (98.8 kg) IBW/kg (Calculated) : 73 Heparin Dosing Weight: 93 kg  Vital Signs: Temp: 97.9 F (36.6 C) (12/08 0432) Temp Source: Oral (12/08 0432) BP: 100/79 mmHg (12/08 0432) Pulse Rate: 86 (12/08 0432)  Labs:  Recent Labs  02/12/15 0520 02/13/15 0527 02/14/15 0428  HGB 11.5* 11.7* 11.9*  HCT 35.5* 36.8* 36.8*  PLT 325 335 350  LABPROT 15.9* 22.3* 24.6*  INR 1.25 1.97* 2.24*  HEPARINUNFRC 0.54  --   --   CREATININE 0.87 0.92  --     Estimated Creatinine Clearance: 111.9 mL/min (by C-G formula based on Cr of 0.92).   Assessment: Patient is a 49 y.o M with recent L tibial fracture who presented to the ED on 12/3 with c/o SOB and CP.  Chest CTA with suspicion for BL LL PE.  LE doppler on 12/04 showed bilateral LE DVTs.  Heparin drip started for PE/DVT, transitioned to Lovenox 12/6. Pharmacy asked to start warfarin 12/4  Today, 02/14/2015: - INR now responding significantly (2.24) after four doses of warfarin 10mg , 10mg , 15mg , 7.5mg  (12/4-12/7) - Hgb and plt stable - SCr stable, CrCl > 100 ml/min - No bleeding documented - Drug-drug interactions with warfarin - none currently (on PRN IV toradol) - Tolerating regular diet  Goal of Therapy:  INR 2-3 Monitor platelets by anticoagulation protocol: Yes   Plan:  Day #5 VTE overlap therapy for acute VTE - Continue lovenox 1mg /kg sq q12h - Warfarin 5mg  PO x 1  - Daily INR  - Warfarin education completed 12/5  Arley Phenixllen Sophy Mesler RPh 02/14/2015, 8:12 AM Pager [phone]
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
## 2015-02-26 LAB — PTT-LA MIX: PTT-LA MIX: 52.6 s — AB (ref 0.0–40.6)

## 2015-02-26 LAB — LUPUS ANTICOAGULANT PANEL
DRVVT: 59 s — ABNORMAL HIGH (ref 0.0–44.0)
PTT Lupus Anticoagulant: 53.6 s — ABNORMAL HIGH (ref 0.0–40.6)

## 2015-02-26 LAB — PROTEIN C ACTIVITY: Protein C Activity: 94 % (ref 73–180)

## 2015-02-26 LAB — PROTEIN S, TOTAL: PROTEIN S AG TOTAL: 157 % — AB (ref 60–150)

## 2015-02-26 LAB — HEXAGONAL PHASE PHOSPHOLIPID: Hexagonal Phase Phospholipid: 16 s — ABNORMAL HIGH (ref 0–11)

## 2015-02-26 LAB — DRVVT MIX: dRVVT Mix: 46.3 s — ABNORMAL HIGH (ref 0.0–44.0)

## 2015-02-26 LAB — PROTEIN S ACTIVITY: Protein S Activity: 100 % (ref 63–140)

## 2015-02-26 LAB — DRVVT CONFIRM: DRVVT CONFIRM: 1.3 ratio — AB (ref 0.8–1.2)

## 2015-03-05 ENCOUNTER — Other Ambulatory Visit (HOSPITAL_COMMUNITY): Payer: Self-pay | Admitting: Internal Medicine

## 2015-03-05 ENCOUNTER — Ambulatory Visit (HOSPITAL_COMMUNITY)
Admission: RE | Admit: 2015-03-05 | Discharge: 2015-03-05 | Disposition: A | Discharge status: Home / Self Care | Payer: Commercial Managed Care - PPO | Source: Ambulatory Visit | Attending: Vascular Surgery | Admitting: Vascular Surgery

## 2015-03-05 DIAGNOSIS — I82592 Chronic embolism and thrombosis of other specified deep vein of left lower extremity: Secondary | ICD-10-CM | POA: Diagnosis not present

## 2015-03-05 DIAGNOSIS — I82542 Chronic embolism and thrombosis of left tibial vein: Secondary | ICD-10-CM | POA: Diagnosis not present

## 2015-03-05 DIAGNOSIS — I82532 Chronic embolism and thrombosis of left popliteal vein: Secondary | ICD-10-CM | POA: Insufficient documentation

## 2015-03-05 DIAGNOSIS — I8290 Acute embolism and thrombosis of unspecified vein: Secondary | ICD-10-CM

## 2015-03-05 DIAGNOSIS — Z86718 Personal history of other venous thrombosis and embolism: Secondary | ICD-10-CM

## 2015-03-05 DIAGNOSIS — I82512 Chronic embolism and thrombosis of left femoral vein: Secondary | ICD-10-CM | POA: Insufficient documentation

## 2015-03-05 NOTE — Progress Notes (Signed)
Preliminary results phoned to [facility] and given verbally to DJ

## 2015-08-28 ENCOUNTER — Other Ambulatory Visit (HOSPITAL_COMMUNITY): Payer: Self-pay | Admitting: Internal Medicine

## 2015-08-28 DIAGNOSIS — I82403 Acute embolism and thrombosis of unspecified deep veins of lower extremity, bilateral: Secondary | ICD-10-CM

## 2015-08-29 ENCOUNTER — Ambulatory Visit (HOSPITAL_COMMUNITY)
Admission: RE | Admit: 2015-08-29 | Discharge: 2015-08-29 | Disposition: A | Discharge status: Home / Self Care | Payer: Commercial Managed Care - PPO | Source: Ambulatory Visit | Attending: Vascular Surgery | Admitting: Vascular Surgery

## 2015-08-29 DIAGNOSIS — Z7901 Long term (current) use of anticoagulants: Secondary | ICD-10-CM | POA: Diagnosis not present

## 2015-08-29 DIAGNOSIS — I82531 Chronic embolism and thrombosis of right popliteal vein: Secondary | ICD-10-CM | POA: Diagnosis not present

## 2015-08-29 DIAGNOSIS — I82403 Acute embolism and thrombosis of unspecified deep veins of lower extremity, bilateral: Secondary | ICD-10-CM

## 2015-08-29 DIAGNOSIS — Z86718 Personal history of other venous thrombosis and embolism: Secondary | ICD-10-CM | POA: Diagnosis present

## 2017-04-06 DIAGNOSIS — M25562 Pain in left knee: Secondary | ICD-10-CM | POA: Diagnosis not present

## 2017-07-16 DIAGNOSIS — M25562 Pain in left knee: Secondary | ICD-10-CM | POA: Diagnosis not present

## 2017-07-27 DIAGNOSIS — M25562 Pain in left knee: Secondary | ICD-10-CM | POA: Diagnosis not present

## 2017-08-03 DIAGNOSIS — M25562 Pain in left knee: Secondary | ICD-10-CM | POA: Diagnosis not present

## 2017-09-20 DIAGNOSIS — M25562 Pain in left knee: Secondary | ICD-10-CM | POA: Diagnosis not present

## 2017-12-02 DIAGNOSIS — R82998 Other abnormal findings in urine: Secondary | ICD-10-CM | POA: Diagnosis not present

## 2017-12-02 DIAGNOSIS — Z0000 Encounter for general adult medical examination without abnormal findings: Secondary | ICD-10-CM | POA: Diagnosis not present

## 2017-12-02 DIAGNOSIS — R7309 Other abnormal glucose: Secondary | ICD-10-CM | POA: Diagnosis not present

## 2017-12-02 DIAGNOSIS — Z125 Encounter for screening for malignant neoplasm of prostate: Secondary | ICD-10-CM | POA: Diagnosis not present

## 2017-12-09 DIAGNOSIS — Z86711 Personal history of pulmonary embolism: Secondary | ICD-10-CM | POA: Diagnosis not present

## 2017-12-09 DIAGNOSIS — Z1389 Encounter for screening for other disorder: Secondary | ICD-10-CM | POA: Diagnosis not present

## 2017-12-09 DIAGNOSIS — R739 Hyperglycemia, unspecified: Secondary | ICD-10-CM | POA: Diagnosis not present

## 2017-12-09 DIAGNOSIS — Z23 Encounter for immunization: Secondary | ICD-10-CM | POA: Diagnosis not present

## 2017-12-09 DIAGNOSIS — Z0000 Encounter for general adult medical examination without abnormal findings: Secondary | ICD-10-CM | POA: Diagnosis not present

## 2017-12-09 DIAGNOSIS — Z8249 Family history of ischemic heart disease and other diseases of the circulatory system: Secondary | ICD-10-CM | POA: Diagnosis not present

## 2017-12-10 ENCOUNTER — Other Ambulatory Visit: Payer: Self-pay | Admitting: Internal Medicine

## 2017-12-10 DIAGNOSIS — Z8249 Family history of ischemic heart disease and other diseases of the circulatory system: Secondary | ICD-10-CM

## 2017-12-17 DIAGNOSIS — Z1212 Encounter for screening for malignant neoplasm of rectum: Secondary | ICD-10-CM | POA: Diagnosis not present

## 2017-12-20 DIAGNOSIS — H16002 Unspecified corneal ulcer, left eye: Secondary | ICD-10-CM | POA: Diagnosis not present

## 2017-12-20 DIAGNOSIS — H1089 Other conjunctivitis: Secondary | ICD-10-CM | POA: Diagnosis not present

## 2017-12-21 DIAGNOSIS — H16002 Unspecified corneal ulcer, left eye: Secondary | ICD-10-CM | POA: Diagnosis not present

## 2017-12-23 DIAGNOSIS — H16002 Unspecified corneal ulcer, left eye: Secondary | ICD-10-CM | POA: Diagnosis not present

## 2018-01-03 ENCOUNTER — Ambulatory Visit
Admission: RE | Admit: 2018-01-03 | Discharge: 2018-01-03 | Disposition: A | Discharge status: Home / Self Care | Payer: Commercial Managed Care - PPO | Source: Ambulatory Visit | Attending: Internal Medicine | Admitting: Internal Medicine

## 2018-01-03 DIAGNOSIS — Z8249 Family history of ischemic heart disease and other diseases of the circulatory system: Secondary | ICD-10-CM

## 2018-01-30 ENCOUNTER — Encounter (HOSPITAL_COMMUNITY): Payer: Self-pay | Admitting: Emergency Medicine

## 2018-01-30 ENCOUNTER — Other Ambulatory Visit: Payer: Self-pay

## 2018-01-30 ENCOUNTER — Emergency Department (HOSPITAL_COMMUNITY)
Admission: EM | Admit: 2018-01-30 | Discharge: 2018-01-30 | Disposition: A | Discharge status: Home / Self Care | Payer: Commercial Managed Care - PPO | Attending: Emergency Medicine | Admitting: Emergency Medicine

## 2018-01-30 DIAGNOSIS — Z87891 Personal history of nicotine dependence: Secondary | ICD-10-CM | POA: Insufficient documentation

## 2018-01-30 DIAGNOSIS — Z79899 Other long term (current) drug therapy: Secondary | ICD-10-CM | POA: Insufficient documentation

## 2018-01-30 DIAGNOSIS — M5431 Sciatica, right side: Secondary | ICD-10-CM | POA: Diagnosis not present

## 2018-01-30 DIAGNOSIS — M545 Low back pain: Secondary | ICD-10-CM | POA: Diagnosis present

## 2018-01-30 DIAGNOSIS — M5441 Lumbago with sciatica, right side: Secondary | ICD-10-CM | POA: Diagnosis not present

## 2018-01-30 MED ORDER — CYCLOBENZAPRINE HCL 10 MG PO TABS: 10.0000 mg | ORAL_TABLET | Freq: Two times a day (BID) | ORAL | 0 refills | Status: DC | PRN

## 2018-01-30 NOTE — ED Provider Notes (Signed)
[facility]-EMERGENCY DEPT Provider Note   CSN: [id number] Arrival date & time: 01/30/18  1116     History   Chief Complaint Chief Complaint  Patient presents with  . Back Pain    HPI Nicholas Conway is a 52 y.o. male who presents to the ED for low back pain. Patient reports working a lot recently and lifting and bending. Hx of back pain in the past but usually resolves with OTC medication. Patient denies loss of control of bladder or bowels or UTI symptoms. Patient states he came in because he is going to catch a plane tomorrow to see his family and wanted to have medication. After home treatment yesterday the pain has improved.   The history is provided by the patient. No language interpreter was used.  Back Pain   This is a new problem. The current episode started yesterday. The problem occurs constantly. The problem has been gradually improving. The pain is associated with no known injury. The pain is present in the lumbar spine. The quality of the pain is described as burning. The pain radiates to the right thigh. The pain is moderate. The symptoms are aggravated by twisting and bending. Associated symptoms include leg pain. Pertinent negatives include no fever, no bowel incontinence, no bladder incontinence, no tingling and no weakness. He has tried NSAIDs and ice for the symptoms. The treatment provided moderate relief. Risk factors include obesity.    Past Medical History:  Diagnosis Date  . Hx of pneumothorax 1983    Patient Active Problem List   Diagnosis Date Noted  . Sinus tachycardia 02/12/2015  . DVT of lower extremity, bilateral (HCC) 02/10/2015  . Pulmonary embolism (HCC) 02/09/2015  . Tibial plateau fracture, left 02/09/2015  . Hypokalemia 02/09/2015  . Leukocytosis 02/09/2015  . QT prolongation 02/09/2015  . Pulmonary embolism without [facility])     Past Surgical History:  Procedure Laterality Date  . EYE MUSCLE SURGERY  Left 1968, 1971        Home Medications    Prior to Admission medications   Medication Sig Start Date End Date Taking? Authorizing Provider  acetaminophen (TYLENOL) 500 MG tablet Take 1,000 mg by mouth every 6 (six) hours as needed (for pain.).    [provider]  cyclobenzaprine (FLEXERIL) 10 MG tablet Take 1 tablet (10 mg total) by mouth 2 (two) times daily as needed for muscle spasms. 01/30/18   Janne Napoleon, NP  diphenhydramine-acetaminophen (TYLENOL PM) 25-500 MG TABS tablet Take 2 tablets by mouth at bedtime as needed (for pain/sleep).    [provider]  HYDROcodone-acetaminophen (NORCO/VICODIN) 5-325 MG tablet Take 1 tablet by mouth 3 (three) times daily as needed. For pain. 01/17/15   [provider]  traMADol (ULTRAM) 50 MG tablet Take 2 tablets (100 mg total) by mouth every 6 (six) hours as needed for moderate pain. 02/14/15   Jerald Kief, MD  warfarin (COUMADIN) 5 MG tablet Take 1 tablet (5 mg total) by mouth one time only at 6 PM. 02/14/15   Jerald Kief, MD  zolpidem (AMBIEN) 5 MG tablet Take 1 tablet (5 mg total) by mouth at bedtime as needed for sleep. 02/14/15   Jerald Kief, MD    Family History Family History  Problem Relation Age of Onset  . Deep vein thrombosis Brother   . Colon cancer Neg Hx     Social History Social History   Tobacco Use  . Smoking status:  Former Smoker    Last attempt to quit: 03/09/1988    Years since quitting: 29.9  . Smokeless tobacco: Never Used  Substance Use Topics  . Alcohol use: Yes    Alcohol/week: 10.0 standard drinks    Types: 10 Glasses of wine per week  . Drug use: No     Allergies   Patient has no known allergies.   Review of Systems Review of Systems  Constitutional: Negative for fever.  Gastrointestinal: Negative for bowel incontinence.  Genitourinary: Negative for bladder incontinence.  Musculoskeletal: Positive for back pain.  Neurological: Negative for tingling and  weakness.  All other systems reviewed and are negative.    Physical Exam Updated Vital Signs BP 127/86 (BP Location: Left Arm)   Pulse (!) 109   Temp 98.4 F (36.9 C) (Oral)   Resp 20   Ht 5\' 10"  (1.778 m)   Wt 99.8 kg   SpO2 96%   BMI 31.57 kg/m   Physical Exam  Constitutional: He appears well-developed and well-nourished. No distress.  HENT:  Head: Normocephalic.  Neck: Neck supple.  Cardiovascular: Normal rate.  Pulmonary/Chest: Effort normal.  Musculoskeletal:       Lumbar back: He exhibits tenderness and spasm. He exhibits no deformity and no laceration. Decreased range of motion: due to pain.       Back:  Tender to palpation right lumbar area and over right sciatic nerve.  Neurological: He is alert. He has normal strength. Gait normal.  Reflex Scores:      Bicep reflexes are 2+ on the right side and 2+ on the left side.      Brachioradialis reflexes are 2+ on the right side and 2+ on the left side.      Patellar reflexes are 2+ on the right side and 2+ on the left side. Skin: Skin is warm and dry.  Psychiatric: He has a normal mood and affect. His behavior is normal.  Nursing note and vitals reviewed.    ED Treatments / Results  Labs (all labs ordered are listed, but only abnormal results are displayed) Labs Reviewed - No data to display  EKG None  Radiology No results found.  Procedures Procedures (including critical care time)  Medications Ordered in ED Medications - No data to display   Initial Impression / Assessment and Plan / ED Course  I have reviewed the triage vital signs and the nursing notes. Patient with back pain.  No neurological deficits and normal neuro exam.  Patient can walk but states is painful.  No loss of bowel or bladder control.  No concern for cauda equina.  No fever, night sweats, weight loss, h/o cancer, IVDU.  RICE protocol and pain medicine indicated and discussed with patient. Discussed muscle relaxer can cause  drowsiness.   Final Clinical Impressions(s) / ED Diagnoses   Final diagnoses:  Sciatica, right side    ED Discharge Orders         Ordered    cyclobenzaprine (FLEXERIL) 10 MG tablet  2 times daily PRN     01/30/18 1216           Damian Leavelleese, [place] M, NP 01/30/18 1225    Azalia Bilisampos, Kevin, MD 01/30/18 1555

## 2018-01-30 NOTE — Discharge Instructions (Addendum)
You may take tylenol in addition to the muscle relaxer. Follow up with your doctor. Return here as needed.

## 2018-01-30 NOTE — ED Notes (Signed)
Bed: WTR5 Expected date:  Expected time:  Means of arrival:  Comments: 

## 2018-01-30 NOTE — ED Triage Notes (Signed)
Patient c/o left lower back pain since yesterday. Denies trauma/injury. Hx of same. Reports pain worsens with movement. Denies urinary sx, changes in bowel/bladder and numbness/tingling.

## 2019-01-26 ENCOUNTER — Other Ambulatory Visit: Payer: Self-pay

## 2019-01-26 DIAGNOSIS — Z20822 Contact with and (suspected) exposure to covid-19: Secondary | ICD-10-CM

## 2019-01-28 LAB — NOVEL CORONAVIRUS, NAA: SARS-CoV-2, NAA: NOT DETECTED

## 2020-08-09 IMAGING — CT CT HEART SCORING
3 series · 14 of 20 positions shown, 16 images · non-contrast
Comparison: 02/09/2015

CLINICAL DATA: Family history of early coronary artery disease

EXAM:
CT HEART FOR CALCIUM SCORING
TECHNIQUE: CT heart was performed on a 64 channel system using prospective ECG
gating.
A non-contrast exam for calcium scoring was performed.
Note that this exam targets the heart and the chest was not imaged
in its entirety.

[Series 2: calcium scoring 2.00 qr36 bestdiast 72% · axial · 0.37mm/px · z∈[+1553,+1649]mm · 4 of 80 slices shown]
[im 16/80  vessel]
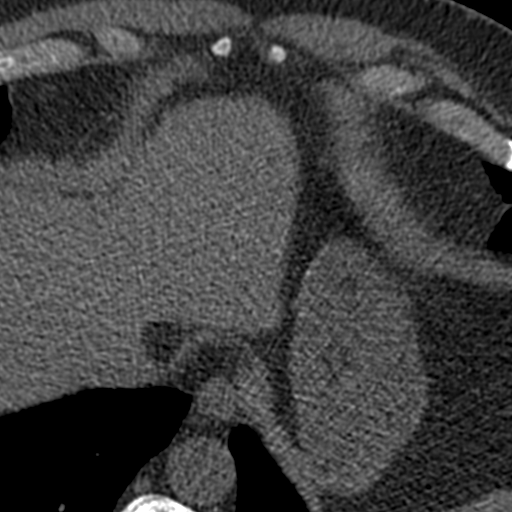
[im 32/80  vessel]
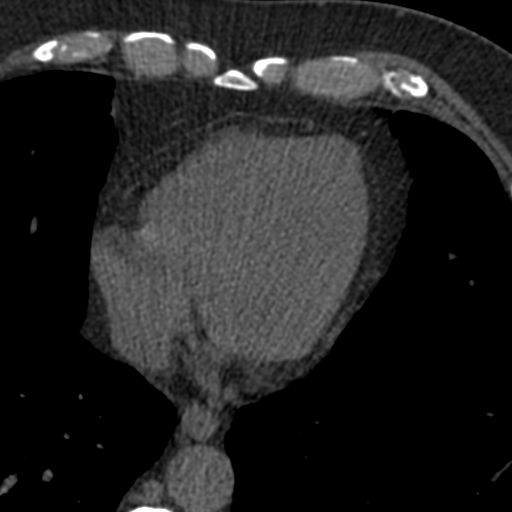
[im 48/80  vessel]
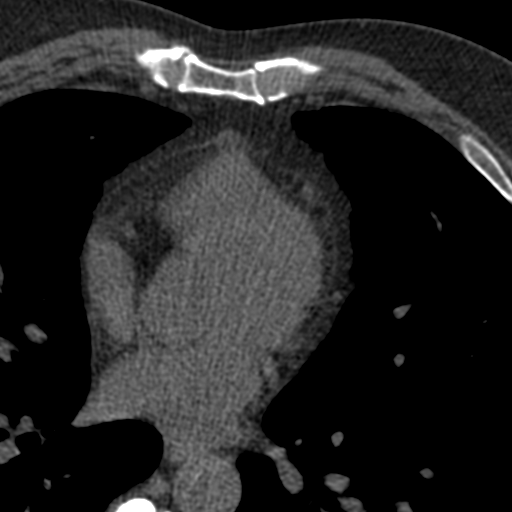
[im 64/80  vessel]
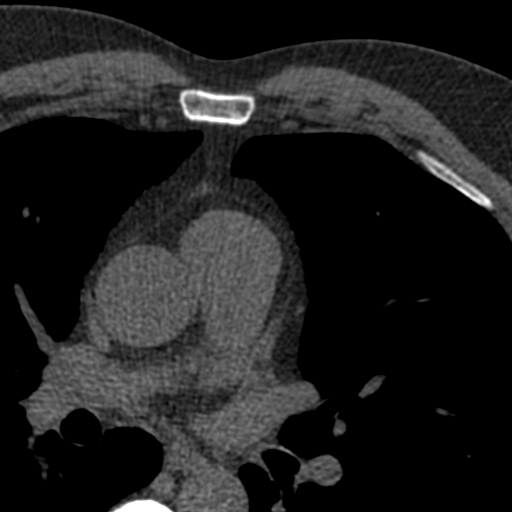

[Series 3: calcium scoring 2.00 br40 bestdiast 72% ax fov · axial · 0.59mm/px · z∈[+1549,+1653]mm · 5 of 80 slices shown, 7 images]
[im 14/80  vessel]
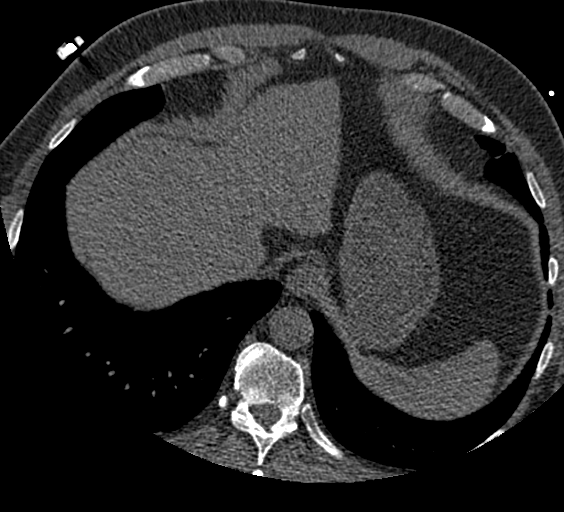
[im 14/80  lung]
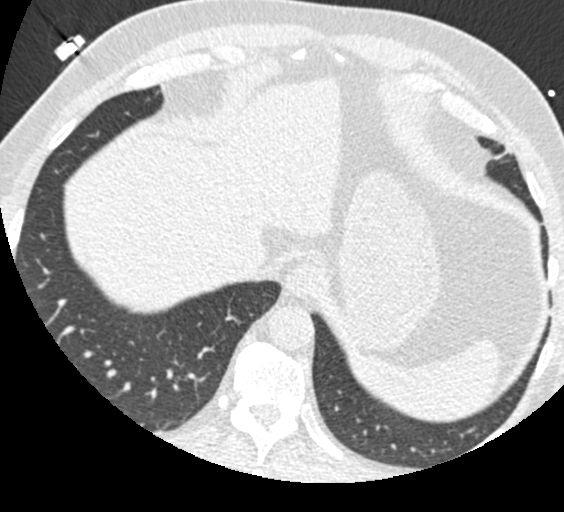
[im 27/80  vessel]
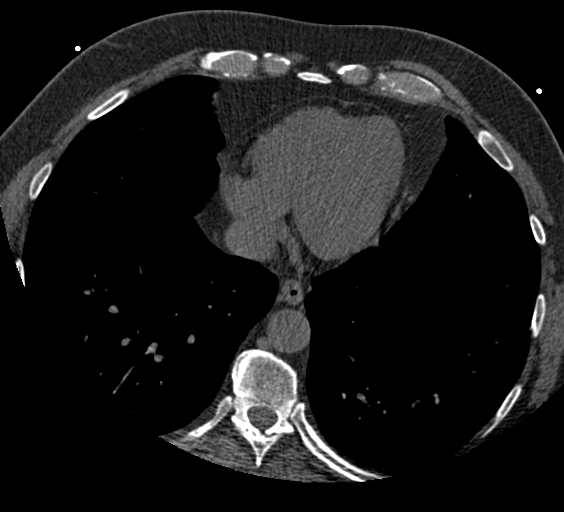
[im 40/80  vessel]
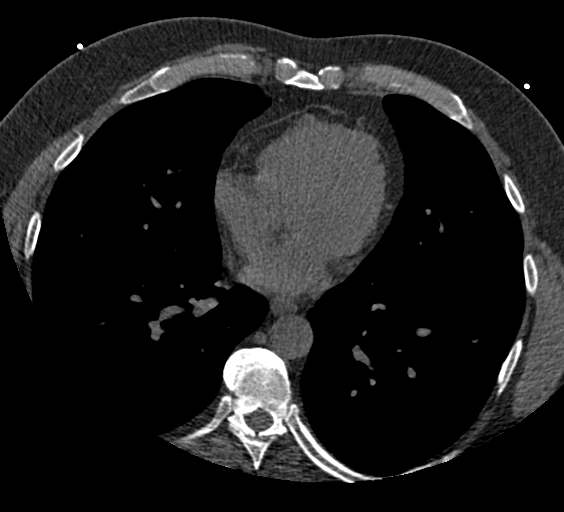
[im 53/80  vessel]
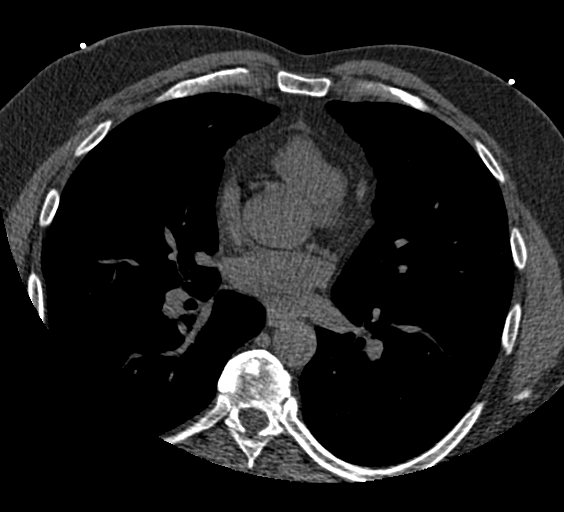
[im 66/80  vessel]
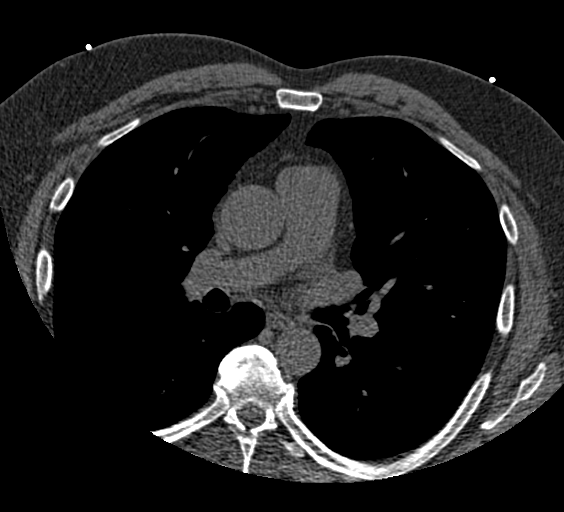
[im 66/80  lung]
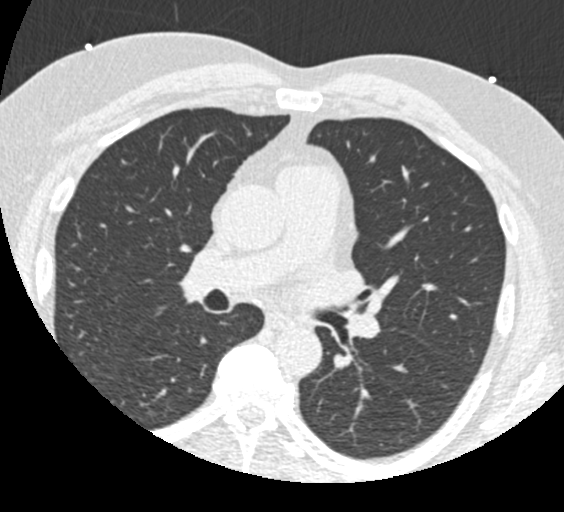

[Series 9: calcium scoring 2.00 br60 bestdiast 72% ax fov · axial · 0.59mm/px · z∈[+1549,+1653]mm · 5 of 80 slices shown]
[im 14/80  vessel]
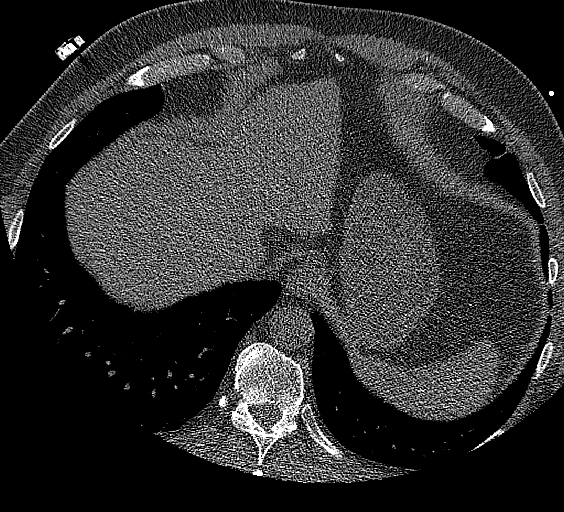
[im 27/80  vessel]
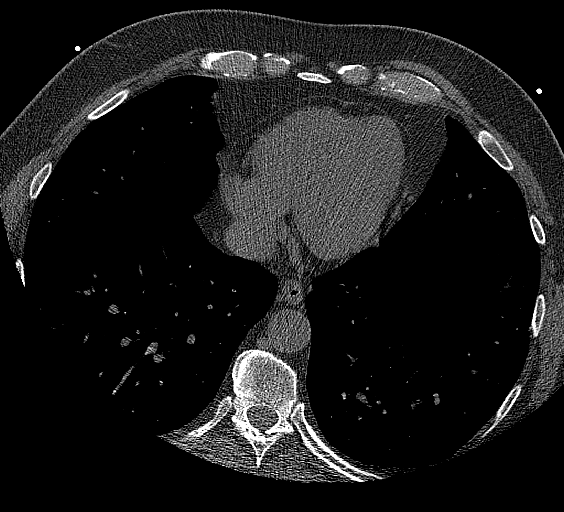
[im 40/80  vessel]
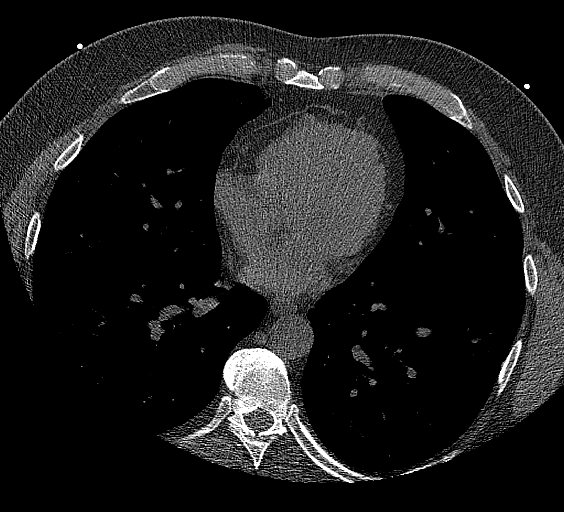
[im 53/80  vessel]
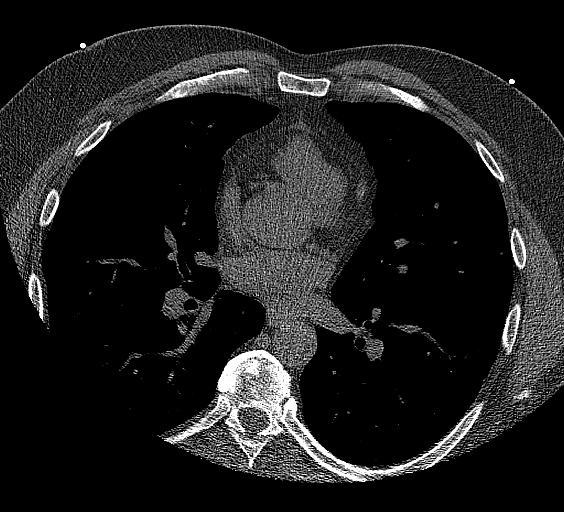
[im 66/80  vessel]
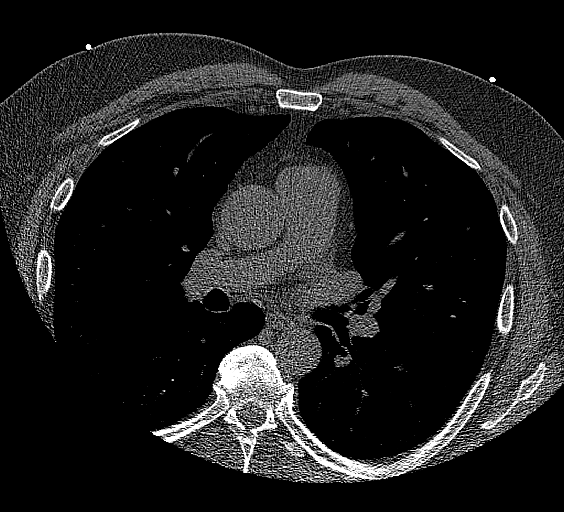

[14 of 20 positions shown; findings below may reference images not displayed]

FINDINGS: Technical quality: Good

CORONARY CALCIUM

Total Agatston Score: 97.5 with calcifications in the left anterior
descending coronary artery

[HOSPITAL] percentile:  81

OTHER FINDINGS:

Cardiovascular: Heart is normal size. Visualized aorta is normal
caliber.

Mediastinum/Nodes: No adenopathy in the lower mediastinum or hila.

Lungs/Pleura: Calcified granuloma in the right lung along the minor
fissure. No confluent opacities or effusions.

Upper Abdomen: Imaging into the upper abdomen shows no acute
findings.

Musculoskeletal: Chest wall soft tissues are unremarkable. No acute
bony abnormality.
IMPRESSION: The observed calcium score of 97.5 is at the percentile 81 for
subjects of the same age, gender and race/ethnicity who are free of
clinical cardiovascular disease and treated diabetes.

No acute or significant extracardiac abnormality

## 2023-11-23 ENCOUNTER — Encounter: Payer: Self-pay | Admitting: Internal Medicine

## 2024-03-14 ENCOUNTER — Ambulatory Visit

## 2024-03-14 VITALS — Ht 69.0 in | Wt 241.0 lb

## 2024-03-14 DIAGNOSIS — Z1211 Encounter for screening for malignant neoplasm of colon: Secondary | ICD-10-CM

## 2024-03-14 MED ORDER — NA SULFATE-K SULFATE-MG SULF 17.5-3.13-1.6 GM/177ML PO SOLN: 1.0000 | Freq: Once | ORAL | 0 refills | Status: AC

## 2024-03-14 NOTE — Progress Notes (Signed)
 RN confirmed patient name, date of birth, and address RN confirmed date and time of procedure RN reviewed and confirmed allergies  RN reviewed and updated current medications; confirmed preferred pharmacy Pt is not on diet pills nor GLP-1 medications Pt is not on blood thinners RN reviewed medical & surgical hx  Pt denies issues with chronic constipation  Diabetic - no No A fib or A flutter  No cardiac tests are pending  Pt is not on home 02  No issues known with past sedation with any surgeries or procedures Patient denies ever being told they had issues or difficulty with intubation  Patient unaware of any fh of malignant hyperthermia Ambulates independently RN reviewed prep instructions and explained time frames for holding certain medications RN answered patient questions; patient stated understanding Prep instructions printed and sent to My Chart

## 2024-03-17 ENCOUNTER — Encounter: Payer: Self-pay | Admitting: Internal Medicine

## 2024-03-23 ENCOUNTER — Encounter: Payer: Self-pay | Admitting: Internal Medicine

## 2024-03-23 ENCOUNTER — Ambulatory Visit: Admitting: Internal Medicine

## 2024-03-23 VITALS — BP 111/79 | HR 92 | Temp 97.5°F | Resp 11 | Ht 69.0 in | Wt 241.0 lb

## 2024-03-23 DIAGNOSIS — K5730 Diverticulosis of large intestine without perforation or abscess without bleeding: Secondary | ICD-10-CM

## 2024-03-23 DIAGNOSIS — Z1211 Encounter for screening for malignant neoplasm of colon: Secondary | ICD-10-CM

## 2024-03-23 DIAGNOSIS — K635 Polyp of colon: Secondary | ICD-10-CM

## 2024-03-23 DIAGNOSIS — D120 Benign neoplasm of cecum: Secondary | ICD-10-CM

## 2024-03-23 MED ORDER — SODIUM CHLORIDE 0.9 % IV SOLN: 500.0000 mL | Freq: Once | INTRAVENOUS | Status: DC

## 2024-03-23 NOTE — Progress Notes (Signed)
 Report to PACU, RN, vss, BBS= Clear.

## 2024-03-23 NOTE — Patient Instructions (Addendum)
 There was 1 little polyp (suspected) at the beginning of the colon and I removed it.  Very tiny.  I think even if it is precancerous you can wait 10 years for another colonoscopy.  I will let you know.  I also saw some diverticulosis which are little pouches or pockets in the colon, very common condition and usually does not bother people.  Please read the handout.  I appreciate the opportunity to care for you. Nicholas Conway Commander, MD, FACG  YOU HAD AN ENDOSCOPIC PROCEDURE TODAY AT THE [facility]:   Refer to the procedure report that was given to you for any specific questions about what was found during the examination.  If the procedure report does not answer your questions, please call your gastroenterologist to clarify.  If you requested that your care partner not be given the details of your procedure findings, then the procedure report has been included in a sealed envelope for you to review at your convenience later.  YOU SHOULD EXPECT: Some feelings of bloating in the abdomen. Passage of more gas than usual.  Walking can help get rid of the air that was put into your GI tract during the procedure and reduce the bloating. If you had a lower endoscopy (such as a colonoscopy or flexible sigmoidoscopy) you may notice spotting of blood in your stool or on the toilet paper. If you underwent a bowel prep for your procedure, you may not have a normal bowel movement for a few days.  Please Note:  You might notice some irritation and congestion in your nose or some drainage.  This is from the oxygen used during your procedure.  There is no need for concern and it should clear up in a day or so.  SYMPTOMS TO REPORT IMMEDIATELY:  Following lower endoscopy (colonoscopy or flexible sigmoidoscopy):  Excessive amounts of blood in the stool  Significant tenderness or worsening of abdominal pains  Swelling of the abdomen that is new, acute  Fever of 100F or higher  For urgent or emergent  issues, a gastroenterologist can be reached at any hour by calling [phone]. Do not use MyChart messaging for urgent concerns.    DIET:  We do recommend a small meal at first, but then you may proceed to your regular diet.  Drink plenty of fluids but you should avoid alcoholic beverages for 24 hours.  ACTIVITY:  You should plan to take it easy for the rest of today and you should NOT DRIVE or use heavy machinery until tomorrow (because of the sedation medicines used during the test).    FOLLOW UP: Our staff will call the number listed on your records the next business day following your procedure.  We will call around 7:15- 8:00 am to check on you and address any questions or concerns that you may have regarding the information given to you following your procedure. If we do not reach you, we will leave a message.     If any biopsies were taken you will be contacted by phone or by letter within the next 1-3 weeks.  Please call us  at [phone] if you have not heard about the biopsies in 3 weeks.    SIGNATURES/CONFIDENTIALITY: You and/or your care partner have signed paperwork which will be entered into your electronic medical record.  These signatures attest to the fact that that the information above on your After Visit Summary has been reviewed and is understood.  Full responsibility of the confidentiality  of this discharge information lies with you and/or your care-partner.

## 2024-03-23 NOTE — Progress Notes (Signed)
 Pt's states no medical or surgical changes since previsit or office visit.

## 2024-03-23 NOTE — Progress Notes (Signed)
 Called to room to assist during endoscopic procedure.  Patient ID and intended procedure confirmed with present staff. Received instructions for my participation in the procedure from the performing physician.

## 2024-03-23 NOTE — Progress Notes (Signed)
 [facility] History and Physical   Primary Care Physician:  Onita Rush, MD   Reason for Procedure:    Encounter Diagnosis  Name Primary?   Colon cancer screening Yes     Plan:    Colonoscopy   The patient was provided an opportunity to ask questions and all were answered. The patient agreed with the plan.   HPI: Nicholas Conway is a 59 y.o. male presenting for a repeat screening colonoscopy, exam 10 years ago without neoplasia   Past Medical History:  Diagnosis Date   Hx of pneumothorax 03/09/1981   Pulmonary embolism (HCC) 12/2014   Tibia fracture 12/2014    Past Surgical History:  Procedure Laterality Date   COLONOSCOPY     EYE MUSCLE SURGERY Left 1968, 1971   KNEE ARTHROSCOPY Left 2016     Current Outpatient Medications  Medication Sig Dispense Refill   acetaminophen  (TYLENOL ) 500 MG tablet Take 1,000 mg by mouth every 6 (six) hours as needed (for pain.). (Patient not taking: No sig reported)     atorvastatin (LIPITOR) 10 MG tablet Take 10 mg by mouth daily.     diphenhydramine-acetaminophen  (TYLENOL  PM) 25-500 MG TABS tablet Take 2 tablets by mouth at bedtime as needed (for pain/sleep).     zolpidem  (AMBIEN ) 5 MG tablet Take 1 tablet (5 mg total) by mouth at bedtime as needed for sleep. (Patient not taking: Reported on 03/14/2024) 30 tablet 0   Current Facility-Administered Medications  Medication Dose Route Frequency Provider Last Rate Last Admin   0.9 %  sodium chloride  infusion  500 mL Intravenous Once Avram Lupita BRAVO, MD        Allergies as of 03/23/2024   (No Known Allergies)    Family History  Problem Relation Age of Onset   Deep vein thrombosis Brother    Colon cancer Neg Hx    Esophageal cancer Neg Hx    Rectal cancer Neg Hx    Stomach cancer Neg Hx    Colon polyps Neg Hx     Social History   Socioeconomic History   Marital status: Divorced    Spouse name: Not on file   Number of children: Not on file   Years of education:  Not on file   Highest education level: Not on file  Occupational History   Not on file  Tobacco Use   Smoking status: Former    Current packs/day: 0.00    Types: Cigarettes    Quit date: 03/09/1988    Years since quitting: 36.0   Smokeless tobacco: Never  Vaping Use   Vaping status: Never Used  Substance and Sexual Activity   Alcohol  use: Yes    Alcohol /week: 5.0 standard drinks of alcohol     Types: 5 Glasses of wine per week    Comment: occasional beer or liquor   Drug use: No   Sexual activity: Not on file  Other Topics Concern   Not on file  Social History Narrative   Not on file   Social Drivers of Health   Tobacco Use: Medium Risk (03/23/2024)   Patient History    Smoking Tobacco Use: Former    Smokeless Tobacco Use: Never    Passive Exposure: Not on Actuary Strain: Not on file  Food Insecurity: Not on file  Transportation Needs: Not on file  Physical Activity: Not on file  Stress: Not on file  Social Connections: Not on file  Intimate Partner Violence: Not on file  Depression (  PHQ2-9): Not on file  Alcohol  Screen: Not on file  Housing: Not on file  Utilities: Not on file  Health Literacy: Not on file    Review of Systems:  All other review of systems negative except as mentioned in the HPI.  Physical Exam: Vital signs BP 129/74   Pulse 73   Temp (!) 97.5 F (36.4 C)   Resp 15   Ht 5' 9 (1.753 m)   Wt 241 lb (109.3 kg)   SpO2 97%   BMI 35.59 kg/m   General:   Alert,  Well-developed, well-nourished, pleasant and cooperative in NAD Lungs:  Clear throughout to auscultation.   Heart:  Regular rate and rhythm; no murmurs, clicks, rubs,  or gallops. Abdomen:  Soft, nontender and nondistended. Normal bowel sounds.   Neuro/Psych:  Alert and cooperative. Normal mood and affect. A and O x 3   @Myli Pae  CHARLENA Commander, MD, [facility] [phone] (pager) 03/23/2024 3:51 PM@

## 2024-03-23 NOTE — Op Note (Signed)
 [facility] Patient Name: Nicholas Conway Procedure Date: 03/23/2024 3:43 PM MRN: [id number] Endoscopist: Lupita FORBES Commander , MD, [id number] Age: 59 Referring MD:  Date of Birth: Jul 08, 1965 Gender: Male Account #: [id number] Procedure:                Colonoscopy Indications:              Screening for colorectal malignant neoplasm, Last                            colonoscopy: 2015 Medicines:                Monitored Anesthesia Care Procedure:                Pre-Anesthesia Assessment:                           - Prior to the procedure, a History and Physical                            was performed, and patient medications and                            allergies were reviewed. The patient's tolerance of                            previous anesthesia was also reviewed. The risks                            and benefits of the procedure and the sedation                            options and risks were discussed with the patient.                            All questions were answered, and informed consent                            was obtained. Prior Anticoagulants: The patient has                            taken no anticoagulant or antiplatelet agents. ASA                            Grade Assessment: II - A patient with mild systemic                            disease. After reviewing the risks and benefits,                            the patient was deemed in satisfactory condition to                            undergo the procedure.  After obtaining informed consent, the colonoscope                            was passed under direct vision. Throughout the                            procedure, the patient's blood pressure, pulse, and                            oxygen saturations were monitored continuously. The                            Olympus Scope SN: [id number] was introduced through                            the anus and advanced to the the cecum,  identified                            by appendiceal orifice and ileocecal valve. The                            colonoscopy was performed without difficulty. The                            patient tolerated the procedure well. The quality                            of the bowel preparation was good. The ileocecal                            valve, appendiceal orifice, and rectum were                            photographed. The bowel preparation used was SUPREP                            via split dose instruction. Scope In: 3:57:29 PM Scope Out: 4:11:28 PM Scope Withdrawal Time: 0 hours 9 minutes 33 seconds  Total Procedure Duration: 0 hours 13 minutes 59 seconds  Findings:                 The perianal and digital rectal examinations were                            normal. Pertinent negatives include normal prostate                            (size, shape, and consistency).                           A 1 mm polyp was found in the cecum. The polyp was                            sessile. The polyp was removed with a  cold biopsy                            forceps. Resection and retrieval were complete.                            Verification of patient identification for the                            specimen was done. Estimated blood loss was minimal.                           Multiple diverticula were found in the sigmoid                            colon.                           The exam was otherwise without abnormality on                            direct and retroflexion views. Complications:            No immediate complications. Estimated Blood Loss:     Estimated blood loss was minimal. Impression:               - One 1 mm polyp in the cecum, removed with a cold                            biopsy forceps. Resected and retrieved.                           - Diverticulosis in the sigmoid colon.                           - The examination was otherwise normal on direct                             and retroflexion views. Recommendation:           - Patient has a contact number available for                            emergencies. The signs and symptoms of potential                            delayed complications were discussed with the                            patient. Return to normal activities tomorrow.                            Written discharge instructions were provided to the                            patient.                           -  Resume previous diet.                           - Continue present medications.                           - Await pathology results.                           - Repeat colonoscopy is recommended. The                            colonoscopy date will be determined after pathology                            results from today's exam become available for                            review. Lupita FORBES Commander, MD 03/23/2024 4:19:38 PM This report has been signed electronically.

## 2024-03-24 ENCOUNTER — Telehealth: Payer: Self-pay

## 2024-03-24 NOTE — Telephone Encounter (Signed)
" °  Follow up Call-     03/23/2024    3:02 PM  Call back number  Post procedure Call Back phone  # [phone]  Permission to leave phone message Yes     Patient questions:  Do you have a fever, pain , or abdominal swelling? No. Pain Score  0 *  Have you tolerated food without any problems? Yes.    Have you been able to return to your normal activities? Yes.    Do you have any questions about your discharge instructions: Diet   No. Medications  No. Follow up visit  No.  Do you have questions or concerns about your Care? No.  Actions: * If pain score is 4 or above: No action needed, pain <4.   "

## 2024-03-29 ENCOUNTER — Ambulatory Visit: Payer: Self-pay | Admitting: Internal Medicine

## 2024-03-29 LAB — SURGICAL PATHOLOGY
